# Patient Record
Sex: Male | Born: 1960
Health system: Southern US, Community
[De-identification: ages and names within clinical notes are randomized; demographics above are authoritative.]

## PROBLEM LIST (undated history)

## (undated) DIAGNOSIS — G43909 Migraine, unspecified, not intractable, without status migrainosus: Secondary | ICD-10-CM

## (undated) DIAGNOSIS — F32A Depression, unspecified: Secondary | ICD-10-CM

## (undated) DIAGNOSIS — E785 Hyperlipidemia, unspecified: Secondary | ICD-10-CM

## (undated) DIAGNOSIS — IMO0001 Reserved for inherently not codable concepts without codable children: Secondary | ICD-10-CM

## (undated) DIAGNOSIS — K589 Irritable bowel syndrome without diarrhea: Secondary | ICD-10-CM

## (undated) DIAGNOSIS — B351 Tinea unguium: Secondary | ICD-10-CM

## (undated) DIAGNOSIS — J301 Allergic rhinitis due to pollen: Secondary | ICD-10-CM

## (undated) DIAGNOSIS — G4700 Insomnia, unspecified: Secondary | ICD-10-CM

## (undated) DIAGNOSIS — E069 Thyroiditis, unspecified: Secondary | ICD-10-CM

## (undated) DIAGNOSIS — M7061 Trochanteric bursitis, right hip: Secondary | ICD-10-CM

## (undated) DIAGNOSIS — F329 Major depressive disorder, single episode, unspecified: Secondary | ICD-10-CM

## (undated) DIAGNOSIS — K219 Gastro-esophageal reflux disease without esophagitis: Secondary | ICD-10-CM

## (undated) DIAGNOSIS — E669 Obesity, unspecified: Secondary | ICD-10-CM

## (undated) HISTORY — DX: Obesity, unspecified: E66.9

## (undated) HISTORY — DX: Allergic rhinitis due to pollen: J30.1

## (undated) HISTORY — DX: Gastro-esophageal reflux disease without esophagitis: K21.9

## (undated) HISTORY — DX: Insomnia, unspecified: G47.00

## (undated) HISTORY — DX: Major depressive disorder, single episode, unspecified: F32.9

## (undated) HISTORY — PX: CHOLECYSTECTOMY: SHX55

## (undated) HISTORY — PX: SHOULDER SURGERY: SHX246

## (undated) HISTORY — DX: Trochanteric bursitis, right hip: M70.61

## (undated) HISTORY — DX: Depression, unspecified: F32.A

## (undated) HISTORY — DX: Irritable bowel syndrome, unspecified: K58.9

## (undated) HISTORY — DX: Reserved for inherently not codable concepts without codable children: IMO0001

## (undated) HISTORY — DX: Hyperlipidemia, unspecified: E78.5

## (undated) HISTORY — DX: Tinea unguium: B35.1

## (undated) HISTORY — DX: Thyroiditis, unspecified: E06.9

## (undated) HISTORY — DX: Migraine, unspecified, not intractable, without status migrainosus: G43.909

---

## 1988-12-27 HISTORY — PX: ESOPHAGOGASTRODUODENOSCOPY: SHX1529

## 1999-10-26 ENCOUNTER — Encounter: Payer: Self-pay | Admitting: Family Medicine

## 1999-10-26 ENCOUNTER — Encounter: Admission: RE | Admit: 1999-10-26 | Discharge: 1999-10-26 | Payer: Self-pay | Admitting: Family Medicine

## 2000-01-22 ENCOUNTER — Encounter: Admission: RE | Admit: 2000-01-22 | Discharge: 2000-01-22 | Payer: Self-pay | Admitting: Family Medicine

## 2000-01-22 ENCOUNTER — Encounter: Payer: Self-pay | Admitting: Family Medicine

## 2001-09-21 ENCOUNTER — Encounter: Admission: RE | Admit: 2001-09-21 | Discharge: 2001-09-21 | Payer: Self-pay | Admitting: Family Medicine

## 2001-09-21 ENCOUNTER — Encounter: Payer: Self-pay | Admitting: Family Medicine

## 2007-04-04 ENCOUNTER — Encounter: Admission: RE | Admit: 2007-04-04 | Discharge: 2007-04-04 | Payer: Self-pay | Admitting: Family Medicine

## 2007-04-04 IMAGING — CR DG CERVICAL SPINE COMPLETE 4+V
6 series · 6 of 6 positions shown · non-contrast
Comparison: none

CLINICAL DATA: Left arm numbness for several months.

 CERVICAL SPINE - 5 VIEW:

[view not recorded (1 of 6)]
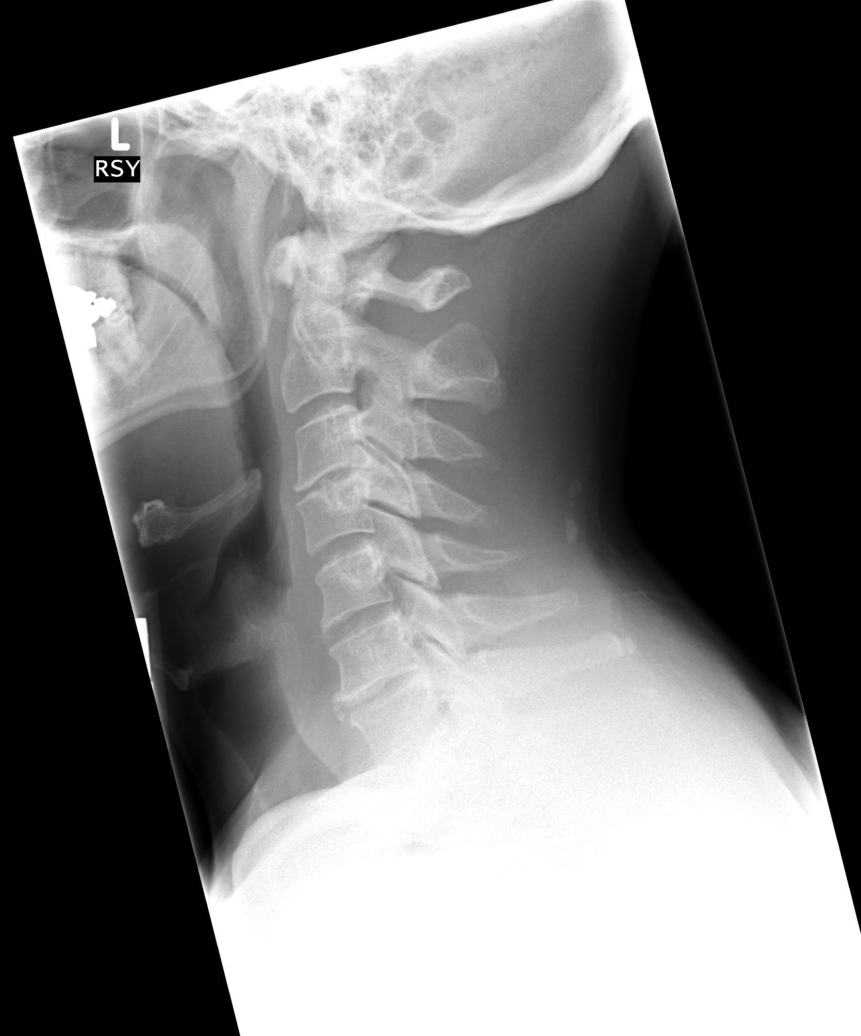

[view not recorded (2 of 6)]
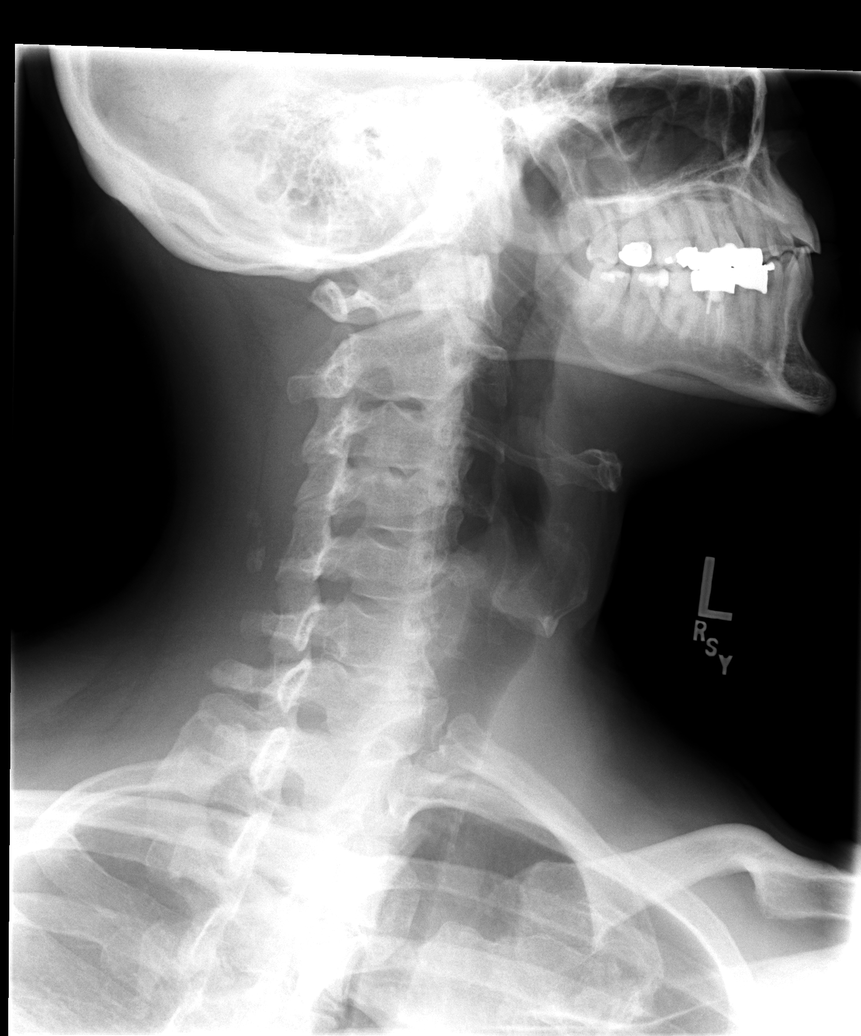

[view not recorded (3 of 6)]
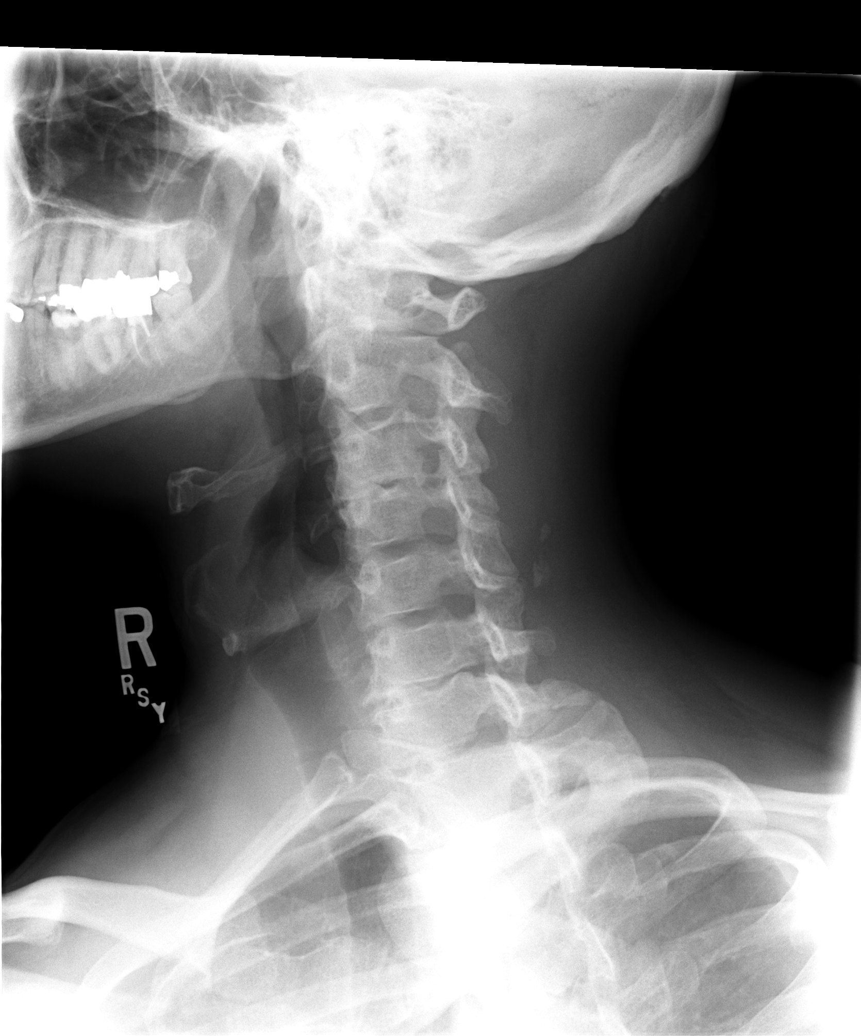

[view not recorded (4 of 6)]
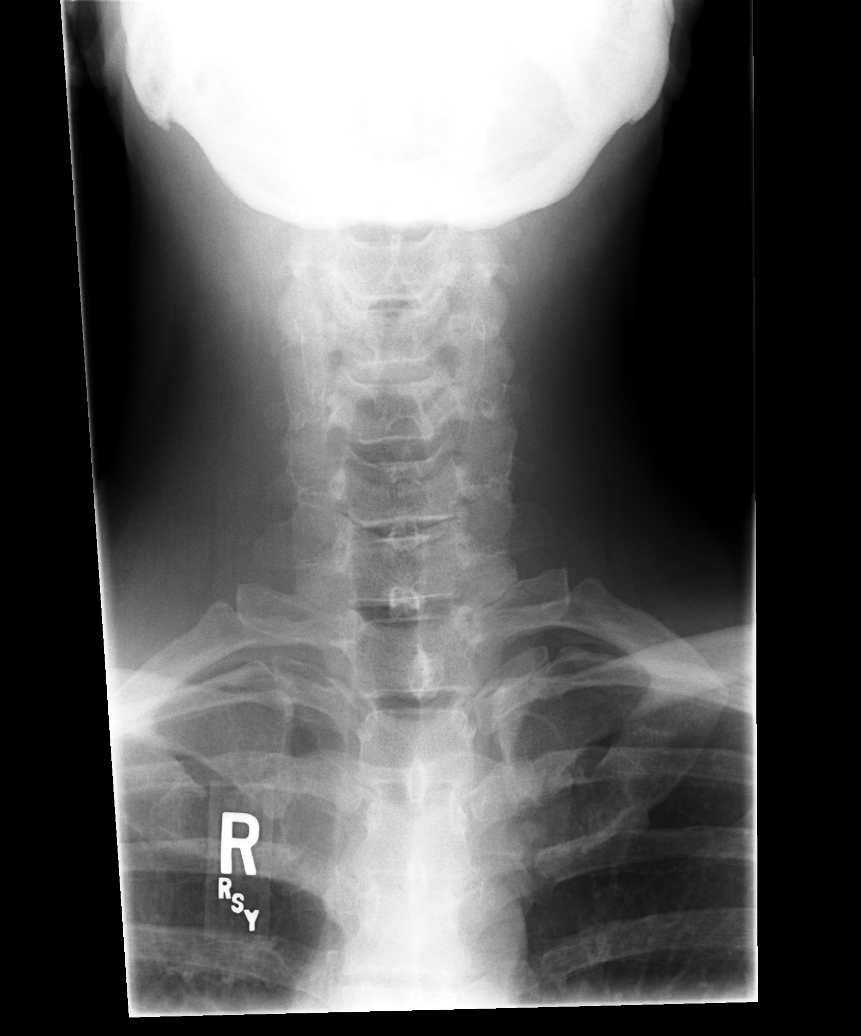

[view not recorded (5 of 6)]
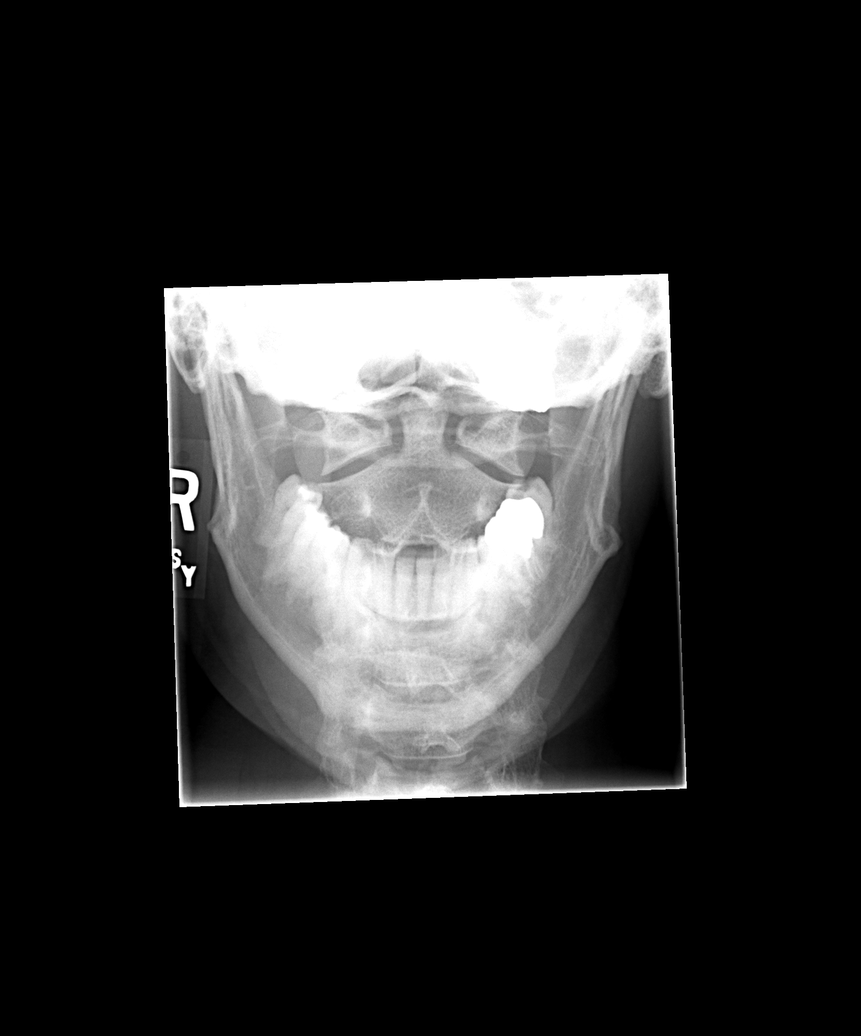

[view not recorded (6 of 6)]
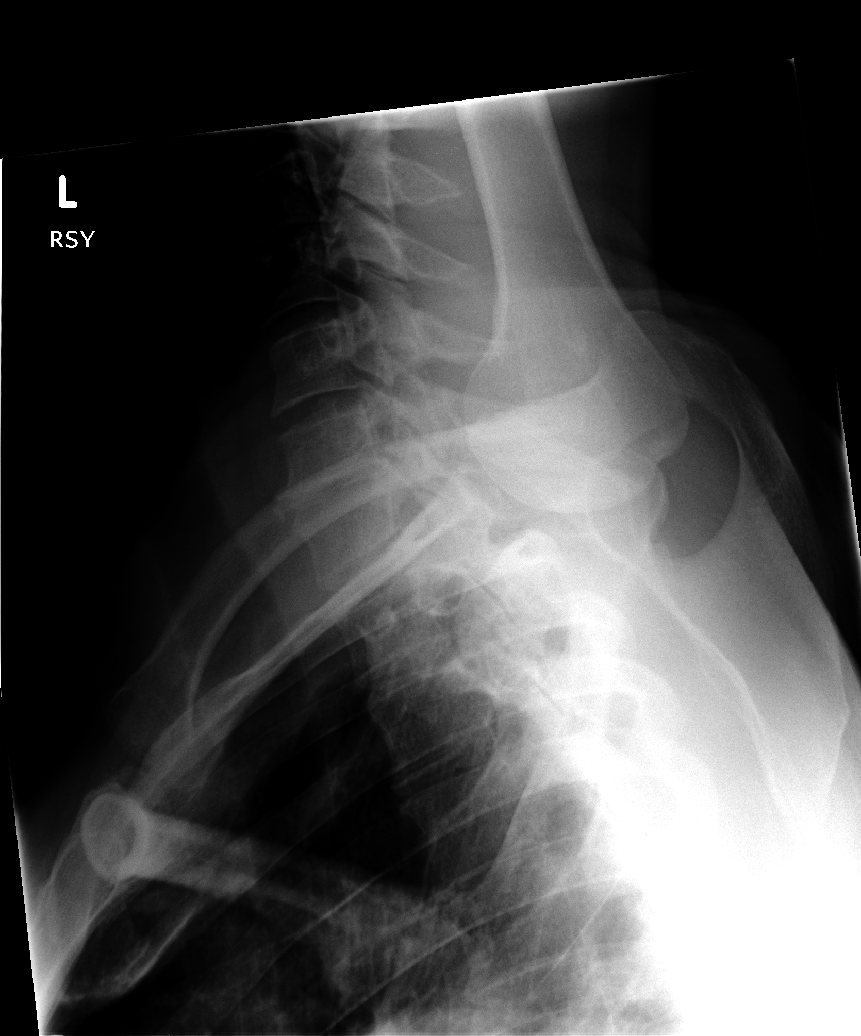

[6 of 6 positions shown; findings below may reference images not displayed]

FINDINGS: Five views of the cervical spine were obtained.  There is degenerative disk disease at the C3-4 and C6-7 levels with loss of disk space, sclerosis and spur formation.  On oblique views, there is foraminal narrowing at both of these levels with the remainder of the foramina being patent.  The odontoid process is intact.
IMPRESSION: Degenerative disk disease at C3-4 and C6-7 with bilateral foraminal narrowing at these levels.

## 2008-06-13 ENCOUNTER — Encounter: Admission: RE | Admit: 2008-06-13 | Discharge: 2008-06-13 | Payer: Self-pay | Admitting: Family Medicine

## 2014-04-26 ENCOUNTER — Other Ambulatory Visit: Payer: Self-pay | Admitting: Internal Medicine

## 2014-04-26 DIAGNOSIS — H3581 Retinal edema: Secondary | ICD-10-CM

## 2014-04-30 ENCOUNTER — Encounter (INDEPENDENT_AMBULATORY_CARE_PROVIDER_SITE_OTHER): Payer: Self-pay

## 2014-04-30 ENCOUNTER — Ambulatory Visit
Admission: RE | Admit: 2014-04-30 | Discharge: 2014-04-30 | Disposition: A | Discharge status: Home / Self Care | Payer: 59 | Source: Ambulatory Visit | Attending: Internal Medicine | Admitting: Internal Medicine

## 2014-04-30 DIAGNOSIS — H3581 Retinal edema: Secondary | ICD-10-CM

## 2015-02-05 ENCOUNTER — Telehealth: Payer: Self-pay | Admitting: Cardiology

## 2015-02-05 NOTE — Telephone Encounter (Signed)
Received records from [facility] (Dr Deland Pretty) for appointment with Dr Martinique on 03/12/15.  Records given to [facility] (medical reccords) for Dr Doug Sou schedule on 03/12/15.  lp

## 2015-03-12 ENCOUNTER — Ambulatory Visit: Payer: Self-pay | Admitting: Cardiology

## 2015-04-30 ENCOUNTER — Ambulatory Visit (INDEPENDENT_AMBULATORY_CARE_PROVIDER_SITE_OTHER): Payer: 59 | Admitting: Cardiology

## 2015-04-30 ENCOUNTER — Encounter: Payer: Self-pay | Admitting: Cardiology

## 2015-04-30 VITALS — BP 104/64 | HR 63 | Ht 67.0 in | Wt 178.9 lb

## 2015-04-30 DIAGNOSIS — R9431 Abnormal electrocardiogram [ECG] [EKG]: Secondary | ICD-10-CM | POA: Diagnosis not present

## 2015-04-30 DIAGNOSIS — E7800 Pure hypercholesterolemia, unspecified: Secondary | ICD-10-CM

## 2015-04-30 NOTE — Progress Notes (Signed)
Cardiology Office Note   Date:  04/30/2015   ID:  Leonard Cook, DOB [birth year], MRN [id number]  PCP:  Horatio Pel, MD  Cardiologist:   Sharry Beining Martinique, MD   Chief Complaint  Patient presents with  . Establish Care    referral from pcp. was worried about was seen on ekg at pcp office      History of Present Illness: Leonard Cook is a 54 y.o. male who presents for evaluation of abnormal Ecg at the request of Dr. Shelia Media. He is generally healthy. He does have a history of dyslipidemia and some family history of CAD. On recent evaluation there was noted some change on his Ecg. He denies any cardiac problems or symptoms. He specifically denies chest pain, SOB, palpitations, or edema. No history of murmur. Stays active although admits he has exercised less recently due to involvement in his children's activities. No history of HTN, DM, or tobacco use. He has lost 10 lbs this year.    Past Medical History  Diagnosis Date  . Hyperlipidemia   . Migraine   . Depressive disorder   . Allergic rhinitis due to pollen   . Reflux   . Onychomycosis     Past Surgical History  Procedure Laterality Date  . Cholecystectomy      age 42     Current Outpatient Prescriptions  Medication Sig Dispense Refill  . aspirin 81 MG tablet Take 81 mg by mouth daily.    . fluticasone (FLONASE) 50 MCG/ACT nasal spray Place into both nostrils daily.    . Multiple Vitamin (MULTIVITAMIN) tablet Take 1 tablet by mouth daily.    Marland Kitchen buPROPion (WELLBUTRIN XL) 150 MG 24 hr tablet Take 1 tablet by mouth daily.  0  . CRESTOR 20 MG tablet Take 1 tablet by mouth daily.    . lansoprazole (PREVACID) 30 MG capsule Take 1 capsule by mouth daily.  0   No current facility-administered medications for this visit.    Allergies:   Amoxicillin; Atorvastatin; Augmentin; Paxil; Serzone; and Zoloft    Social History:  The patient  reports that he has never smoked. He does not have any smokeless tobacco history on  file.   Family History:  The patient's family history includes Heart attack in his paternal grandmother; Heart disease (age of onset: 69) in his father; Hyperlipidemia in his brother, brother, and father; Hypertension in his brother; Prostate cancer in his father; Stroke in his paternal grandfather.    ROS:  Please see the history of present illness.   Otherwise, review of systems are positive for none.   All other systems are reviewed and negative.    PHYSICAL EXAM: VS:  BP 104/64 mmHg  Pulse 63  Ht 5\' 7"  (1.702 m)  Wt 178 lb 14.4 oz (81.149 kg)  BMI 28.01 kg/m2 , BMI Body mass index is 28.01 kg/(m^2). GEN: Well nourished, well developed, in no acute distress HEENT: normal Neck: no JVD, carotid bruits, or masses Cardiac: RRR; no murmurs, rubs, or gallops,no edema  Respiratory:  clear to auscultation bilaterally, normal work of breathing GI: soft, nontender, nondistended, + BS MS: no deformity or atrophy Skin: warm and dry, no rash Neuro:  Strength and sensation are intact Psych: euthymic mood, full affect   EKG:  EKG is ordered today. The ekg ordered today demonstrates NSR with normal Ecg. I have personally reviewed and interpreted this study. Ecg from 02/04/15 and 01/15/14. These show poor septal forces. The Ecg from 02/04/15  appears to have motion artifact in leads Avf and V3. No other significant ST-T changes.   Recent Labs: No results found for requested labs within last 365 days.    Lipid Panel No results found for: CHOL, TRIG, HDL, CHOLHDL, VLDL, LDLCALC, LDLDIRECT    Wt Readings from Last 3 Encounters:  04/30/15 178 lb 14.4 oz (81.149 kg)      Other studies Reviewed: Additional studies/ records that were reviewed today include: none. Review of the above records demonstrates: N/A   ASSESSMENT AND PLAN:  1.  Abnormal Ecg. Prior Ecg show decreased forces in V1-2 but Ecg today looks normal. This may be a lead placement issue. I do not see any significant ST-T changes  other than some artifact. He has no cardiac symptoms. He does have risk factors of dyslipidemia and some family history of vascular disease. At this point I would recommend continued risk factor modification with lipid lowering therapy, regular aerobic exercise, weight control, and healthy diet. I would not pursue stress testing unless he develops new symptoms. I will follow up as needed.    Current medicines are reviewed at length with the patient today.  The patient does not have concerns regarding medicines.  The following changes have been made:  no change  Labs/ tests ordered today include:  Orders Placed This Encounter  Procedures  . EKG 12-Lead     Disposition:   FU with prn   Signed, Waldon Sheerin Martinique, MD, [facility] 04/30/2015 5:55 PM    [facility] [address], [place], [place], [address] Phone [phone], Fax [phone]

## 2015-05-02 ENCOUNTER — Encounter: Payer: Self-pay | Admitting: Cardiology

## 2015-12-02 ENCOUNTER — Other Ambulatory Visit: Payer: Self-pay | Admitting: Orthopedic Surgery

## 2015-12-02 DIAGNOSIS — S8992XA Unspecified injury of left lower leg, initial encounter: Secondary | ICD-10-CM | POA: Insufficient documentation

## 2015-12-03 ENCOUNTER — Ambulatory Visit
Admission: RE | Admit: 2015-12-03 | Discharge: 2015-12-03 | Disposition: A | Discharge status: Home / Self Care | Payer: 59 | Source: Ambulatory Visit | Attending: Orthopedic Surgery | Admitting: Orthopedic Surgery

## 2015-12-03 DIAGNOSIS — S8992XA Unspecified injury of left lower leg, initial encounter: Secondary | ICD-10-CM

## 2016-01-13 DIAGNOSIS — Z9889 Other specified postprocedural states: Secondary | ICD-10-CM | POA: Insufficient documentation

## 2016-01-28 HISTORY — PX: KNEE ARTHROSCOPY WITH ANTERIOR CRUCIATE LIGAMENT (ACL) REPAIR: SHX5644

## 2016-11-10 ENCOUNTER — Other Ambulatory Visit: Payer: Self-pay | Admitting: Internal Medicine

## 2016-11-10 DIAGNOSIS — R079 Chest pain, unspecified: Secondary | ICD-10-CM

## 2016-11-11 ENCOUNTER — Ambulatory Visit (HOSPITAL_COMMUNITY)
Admission: RE | Admit: 2016-11-11 | Discharge: 2016-11-11 | Disposition: A | Discharge status: Home / Self Care | Payer: 59 | Source: Ambulatory Visit | Attending: Cardiology | Admitting: Cardiology

## 2016-11-11 DIAGNOSIS — R079 Chest pain, unspecified: Secondary | ICD-10-CM | POA: Diagnosis not present

## 2016-11-11 LAB — EXERCISE TOLERANCE TEST
CHL RATE OF PERCEIVED EXERTION: 17
CSEPEW: 14.1 METS
CSEPHR: 104 %
Exercise duration (min): 12 min
Exercise duration (sec): 25 s
MPHR: 164 {beats}/min
Peak HR: 171 {beats}/min
Rest HR: 78 {beats}/min

## 2017-01-07 DIAGNOSIS — H47321 Drusen of optic disc, right eye: Secondary | ICD-10-CM | POA: Diagnosis not present

## 2017-01-07 DIAGNOSIS — H04123 Dry eye syndrome of bilateral lacrimal glands: Secondary | ICD-10-CM | POA: Diagnosis not present

## 2017-01-20 DIAGNOSIS — Z9889 Other specified postprocedural states: Secondary | ICD-10-CM | POA: Diagnosis not present

## 2017-01-20 DIAGNOSIS — S8992XD Unspecified injury of left lower leg, subsequent encounter: Secondary | ICD-10-CM | POA: Diagnosis not present

## 2017-01-20 DIAGNOSIS — Z967 Presence of other bone and tendon implants: Secondary | ICD-10-CM | POA: Diagnosis not present

## 2017-01-23 DIAGNOSIS — Z23 Encounter for immunization: Secondary | ICD-10-CM | POA: Diagnosis not present

## 2017-02-10 DIAGNOSIS — Z125 Encounter for screening for malignant neoplasm of prostate: Secondary | ICD-10-CM | POA: Diagnosis not present

## 2017-02-10 DIAGNOSIS — Z0000 Encounter for general adult medical examination without abnormal findings: Secondary | ICD-10-CM | POA: Diagnosis not present

## 2017-02-15 DIAGNOSIS — K589 Irritable bowel syndrome without diarrhea: Secondary | ICD-10-CM | POA: Diagnosis not present

## 2017-02-15 DIAGNOSIS — E7800 Pure hypercholesterolemia, unspecified: Secondary | ICD-10-CM | POA: Diagnosis not present

## 2017-02-15 DIAGNOSIS — Z79899 Other long term (current) drug therapy: Secondary | ICD-10-CM | POA: Diagnosis not present

## 2017-02-15 DIAGNOSIS — Z0001 Encounter for general adult medical examination with abnormal findings: Secondary | ICD-10-CM | POA: Diagnosis not present

## 2017-03-03 DIAGNOSIS — D485 Neoplasm of uncertain behavior of skin: Secondary | ICD-10-CM | POA: Diagnosis not present

## 2017-03-03 DIAGNOSIS — L578 Other skin changes due to chronic exposure to nonionizing radiation: Secondary | ICD-10-CM | POA: Diagnosis not present

## 2017-03-03 DIAGNOSIS — L720 Epidermal cyst: Secondary | ICD-10-CM | POA: Diagnosis not present

## 2017-03-03 DIAGNOSIS — D225 Melanocytic nevi of trunk: Secondary | ICD-10-CM | POA: Diagnosis not present

## 2017-03-18 DIAGNOSIS — H10413 Chronic giant papillary conjunctivitis, bilateral: Secondary | ICD-10-CM | POA: Diagnosis not present

## 2017-03-18 DIAGNOSIS — H04123 Dry eye syndrome of bilateral lacrimal glands: Secondary | ICD-10-CM | POA: Diagnosis not present

## 2017-09-26 ENCOUNTER — Encounter: Payer: Self-pay | Admitting: Neurology

## 2017-09-26 ENCOUNTER — Ambulatory Visit (INDEPENDENT_AMBULATORY_CARE_PROVIDER_SITE_OTHER): Payer: 59 | Admitting: Neurology

## 2017-09-26 ENCOUNTER — Encounter (INDEPENDENT_AMBULATORY_CARE_PROVIDER_SITE_OTHER): Payer: Self-pay

## 2017-09-26 VITALS — BP 126/87 | HR 80 | Ht 67.0 in | Wt 160.0 lb

## 2017-09-26 DIAGNOSIS — F5104 Psychophysiologic insomnia: Secondary | ICD-10-CM

## 2017-09-26 DIAGNOSIS — R202 Paresthesia of skin: Secondary | ICD-10-CM | POA: Diagnosis not present

## 2017-09-26 DIAGNOSIS — R200 Anesthesia of skin: Secondary | ICD-10-CM

## 2017-09-26 NOTE — Progress Notes (Signed)
SLEEP MEDICINE CLINIC   Provider:  Larey Seat, M D  Primary Care Physician:  Deland Pretty, MD   Referring Provider: Deland Pretty, MD   Chief Complaint  Patient presents with  . New Patient (Initial Visit)    late july, pt noticed left arm numbness and feels like could be in the muscle. pt has tried slee    HPI:  Leonard Cook is a 56 y.o. male , seen here as in a referral  from Dr. Shelia Media for left arm numbness.  Chief complaint according to patient : left arm numbness without swelling , the patient is right hand dominant. He also has sleep problems.   The patient has noticed a left arm numbness without associated swelling, he describes the sensation as coming from the depth of his arm and forearm on the medial side. This occurs when he drives, carries a backpack is active. He normally doesn't notice anything during sleep or in the morning hours just after waking up. He begun having the sensation in late July. He has been treated for depression-anxiety and has used different antidepressant. He takes Wellbutrin in the morning, but developed a very mild bilateral tremor. This is likely medication induced has not changed his handwriting or coordination. He has been treated for trochanteric bursitis of the right hip, major depressive disorder, irritable bowel syndrome, GERD, and has a history of thyroiditis. I reviewed his current medications he has been on Ambien 10 mg daily at bedtime. He takes bupropion extended release 12 hour tablet orally once a day only 100 mg. Lorazepam 0.5 milligrams 4 times a day. Simvastatin 20 mg daily.  [name] reported that during his high school years in college years he did suffer some neck injuries, he had wondered if there is a circulatory issue versus a neuropathic or radicular nerve injury. He had no recent neck trauma, head trauma.  Sleep habits are as follows:  " all of my problems have been relating to my not-sleeping". The patient usually goes  to bed between 10:30 and 12:30, but struggles to go to sleep. He currently takes Ambien and still waits hours before sleep cycles. He wakes up frequently during the night usually at 4:35 AM which is not the time he has to arise, with difficulties to fall asleep again. Feeling clammy and distressed. Gets only 4-5 hours of sleep at the max.    Social history: works for [employer].  Lost his parents 70 and 12 month ago- felt the grief evolved into insomnia, and his own mortality. His children are 31 and 53 years old, married . Works long hours as a Editor, commissioning for [employer],  Worried a lot about professional future, the changes in his company- his children being you.  Caffeine - only in AM, 1/2 soda some days. ETOH- yes, 2 beers a week. No tobacco use.      Review of Systems: Out of a complete 14 system review, the patient complains of only the following symptoms, and all other reviewed systems are negative. I feel tired, I have insomnia, aches and pains.   Epworth score  N/a  , Fatigue severity score n/a  , depression score n/a    Social History   Social History  . Marital status: Married    Spouse name: N/A  . Number of children: 2  . Years of education: N/A   Occupational History  . Nurse, learning disability    Social History Main Topics  . Smoking  status: Never Smoker  . Smokeless tobacco: Never Used  . Alcohol use Yes     Comment: 2 beers a wk  . Drug use: No  . Sexual activity: Not on file   Other Topics Concern  . Not on file   Social History Narrative  . No narrative on file    Family History  Problem Relation Age of Onset  . Prostate cancer Father   . Hyperlipidemia Father   . Heart disease Father 18       PCI x2  . Hyperlipidemia Brother   . Hypertension Brother   . Heart attack Paternal Grandmother   . Stroke Paternal Grandfather   . Hyperlipidemia Brother     Past Medical History:  Diagnosis Date  . Allergic rhinitis due to pollen   .  Depressive disorder   . Hyperlipidemia   . Migraine   . Onychomycosis   . Reflux     Past Surgical History:  Procedure Laterality Date  . CHOLECYSTECTOMY     age 73    Current Outpatient Prescriptions  Medication Sig Dispense Refill  . aspirin 81 MG tablet Take 81 mg by mouth daily.    Marland Kitchen buPROPion (WELLBUTRIN SR) 100 MG 12 hr tablet   0  . LORazepam (ATIVAN) 0.5 MG tablet take 1 tablet by mouth four times a day if needed for anxiety  0  . Multiple Vitamin (MULTIVITAMIN) tablet Take 1 tablet by mouth daily.    . simvastatin (ZOCOR) 20 MG tablet Take 20 mg by mouth.    . zolpidem (AMBIEN) 10 MG tablet take 1 tablet by mouth at bedtime if needed for sleep  0   No current facility-administered medications for this visit.     Allergies as of 09/26/2017 - Review Complete 09/26/2017  Allergen Reaction Noted  . Amoxicillin Other (See Comments) 10/04/2014  . Atorvastatin Other (See Comments) 10/04/2014  . Augmentin [amoxicillin-pot clavulanate] Other (See Comments) 10/04/2014  . Paxil [paroxetine hcl] Diarrhea 10/04/2014  . Serzone [nefazodone]  10/04/2014  . Zoloft [sertraline hcl] Diarrhea 10/04/2014    Vitals: BP 126/87   Pulse 80   Ht 5\' 7"  (1.702 m)   Wt 160 lb (72.6 kg)   BMI 25.06 kg/m  Last Weight:  Wt Readings from Last 1 Encounters:  09/26/17 160 lb (72.6 kg)   ZOX:WRUE mass index is 25.06 kg/m.     Last Height:   Ht Readings from Last 1 Encounters:  09/26/17 5\' 7"  (1.702 m)    Physical exam:  General: The patient is awake, alert and appears not in acute distress. The patient is well groomed. Head: Normocephalic, atraumatic. Neck is supple. Mallampati 2  neck circumference:15.75  Nasal airflow patent , Retrognathia is not seen.  Cardiovascular:  Regular rate and rhythm , without  murmurs or carotid bruit, and without distended neck veins. Respiratory: Lungs are clear to auscultation. Skin:  Without evidence of edema, or rash Trunk: BMI is elevated - not  obese. . The patient's posture is erect  Neurologic exam : The patient is awake and alert, oriented to place and time.   Memory subjective described as impaired  Attention span & concentration ability appears normal.  Speech is fluent,  without  dysarthria, dysphonia or aphasia.  Mood and affect are depressed.   Cranial nerves: Pupils are equal and briskly reactive to light.  Extraocular movements  in vertical and horizontal planes intact and without nystagmus. Visual fields by finger perimetry are intact.Hearing to finger rub intact.  Facial sensation intact to fine touch.Facial motor strength is symmetric and tongue and uvula move midline. Shoulder shrug was symmetrical.   Motor exam:   Normal tone, muscle bulk and symmetric strength in all extremities. Sensory:  Fine touch, pinprick and vibration were tested in all extremities. His left arm medial antebrachium is feeling numb, coming and going.  Proprioception tested in the upper extremities was normal.  Coordination:Finger-to-nose maneuver  normal without evidence of ataxia, dysmetria or tremor. Gait and station: Patient walks without assistive device . Stance is stable and normal.  Turns with 3 Steps. Romberg testing is  Negative. status left ACL repair.  Deep tendon reflexes: in the  upper and lower extremities are symmetrically attenuated  and intact. Babinski maneuver response is  downgoing.  Assessment:  After physical and neurologic examination, review of laboratory studies,  Personal review of imaging studies, reports of other /same  Imaging studies, results of polysomnography and / or neurophysiology testing and pre-existing records as far as provided in visit., my assessment is   1)  I will evaluate Mr. Plessinger's let arm numbness by NCV and EMG- I don't see a circulatory issue here- his arms is equally warm,  He also has similar muscle mass, both sides have equal reflexes. Marland Kitchen    2) Depression with somatization.  3) Insomnia -  chronic and related to depression, anxiety - early morning awakenings- on Ambien without success. I recommend cognitive behaviour therapy.  May return to 150 mg Wellbutrin in AM. He failed trazodone. Melatonin has not worked. He doesn't snore and has not had witnessed apnea.   The patient was advised of the nature of the diagnosed disorder , the treatment options and the  risks for general health and wellness arising from not treating the condition.   I spent more than 45 minutes of face to face time with the patient.  Greater than 50% of time was spent in counseling and coordination of care. We have discussed the diagnosis and differential and I answered the patient's questions.    Plan:  Treatment plan and additional workup :   Arm numbness- check cervical spine and NCV/EMG . He has noted strength loss in the left arm- I cannot objectify this.  Insomnia- has all the signs of depression related anxiety and insomnia. Referral to cognitive behavior therapy per Dr. Shelia Media ( or me ) -Needs sleep hygiene adjustments. May use ambien- alternating with trazodone to sleep.  Xanax / Ativan / Klonopin may be used for 21 days. Habit forming.   Rv after EMG/ NCV    Larey Seat, MD 97/08/4800, 65:53 AM  Certified in Neurology by ABPN Certified in [place] by [facility] [address] [place], [place] [address]

## 2017-09-26 NOTE — Patient Instructions (Signed)

## 2017-10-12 ENCOUNTER — Ambulatory Visit (INDEPENDENT_AMBULATORY_CARE_PROVIDER_SITE_OTHER): Payer: 59 | Admitting: Neurology

## 2017-10-12 ENCOUNTER — Encounter: Payer: Self-pay | Admitting: Neurology

## 2017-10-12 ENCOUNTER — Ambulatory Visit (INDEPENDENT_AMBULATORY_CARE_PROVIDER_SITE_OTHER): Payer: Self-pay | Admitting: Neurology

## 2017-10-12 DIAGNOSIS — R202 Paresthesia of skin: Secondary | ICD-10-CM

## 2017-10-12 DIAGNOSIS — R200 Anesthesia of skin: Secondary | ICD-10-CM

## 2017-10-12 DIAGNOSIS — F5104 Psychophysiologic insomnia: Secondary | ICD-10-CM

## 2017-10-12 NOTE — Progress Notes (Signed)
Please refer EMG and nerve conduction study procedure note. 

## 2017-10-12 NOTE — Procedures (Signed)
     HISTORY:  Leonard Cook is a 56 year old gentleman with onset of left forearm discomfort and some numbness into the hand that began in July 2018. He denies any neck or shoulder discomfort. There may be some slight weakness of the left hand. He is being evaluated for this issue.  NERVE CONDUCTION STUDIES:  Nerve conduction studies were performed on both upper extremities. The distal motor latencies and motor amplitudes for the median and ulnar nerves were within normal limits. The F wave latencies and nerve conduction velocities for these nerves were also normal. The sensory latencies for the median and ulnar nerves were normal.   EMG STUDIES:  EMG study was performed on the left upper extremity:  The first dorsal interosseous muscle reveals 2 to 4 K units with full recruitment. No fibrillations or positive waves were noted. The abductor pollicis brevis muscle reveals 2 to 4 K units with full recruitment. No fibrillations or positive waves were noted. The extensor indicis proprius muscle reveals 1 to 3 K units with full recruitment. No fibrillations or positive waves were noted. The pronator teres muscle reveals 2 to 3 K units with full recruitment. No fibrillations or positive waves were noted. The biceps muscle reveals 1 to 2 K units with full recruitment. No fibrillations or positive waves were noted. The triceps muscle reveals 2 to 4 K units with full recruitment. No fibrillations or positive waves were noted. The anterior deltoid muscle reveals 2 to 3 K units with full recruitment. No fibrillations or positive waves were noted. The cervical paraspinal muscles were tested at 2 levels. No abnormalities of insertional activity were seen at either level tested. There was good relaxation.   IMPRESSION:  Nerve conduction studies done on both upper extremities were within normal limits. No evidence of a neuropathy is seen. EMG evaluation of the left upper extremity is normal, there is  no evidence of an overlying cervical radiculopathy.  Leonard Alexanders MD 10/12/2017 1:33 PM  [facility] [address] [place], [place] [address]  Phone [phone] Fax [phone]

## 2017-10-13 ENCOUNTER — Telehealth: Payer: Self-pay | Admitting: Neurology

## 2017-10-13 NOTE — Telephone Encounter (Signed)
Called patient and made him aware that the NCV and EMG were both normal per Dr Brett Fairy. Pt had no questions and verbalized understanding.

## 2017-10-13 NOTE — Progress Notes (Signed)
    [place]    Nerve / Sites Muscle Latency Ref. Amplitude Ref. Rel Amp Segments Distance Velocity Ref. Area    ms ms mV mV %  cm m/s m/s mVms  L Median - APB     Wrist APB 3.5 ?4.4 12.1 ?4.0 100 Wrist - APB 7   36.5     Upper arm APB 7.5  12.3  102 Upper arm - Wrist 25 62 ?49 35.2  R Median - APB     Wrist APB 3.8 ?4.4 12.3 ?4.0 100 Wrist - APB 7   34.5     Upper arm APB 8.1  12.1  98 Upper arm - Wrist 24 56 ?49 33.7  L Ulnar - ADM     Wrist ADM 3.4 ?3.3 13.2 ?6.0 100 Wrist - ADM 7   37.3     B.Elbow ADM 6.9  11.8  89 B.Elbow - Wrist 19 55 ?49 38.4     A.Elbow ADM 8.6  11.6  98.1 A.Elbow - B.Elbow 10 58 ?49 38.9         A.Elbow - Wrist      R Ulnar - ADM     Wrist ADM 2.4 ?3.3 15.4 ?6.0 100 Wrist - ADM 7   39.4     B.Elbow ADM 6.1  14.9  96.6 B.Elbow - Wrist 21 56 ?49 38.2     A.Elbow ADM 7.8  14.5  97.7 A.Elbow - B.Elbow 10 60 ?49 37.7         A.Elbow - Wrist                 SNC    Nerve / Sites Rec. Site Peak Lat Amp Segments Distance    ms V  cm  L Median - Orthodromic (Dig II, Mid palm)     Dig II Wrist 3.4 26 Dig II - Wrist 13  R Median - Orthodromic (Dig II, Mid palm)     Dig II Wrist 3.5 31 Dig II - Wrist 13  L Ulnar - Orthodromic, (Dig V, Mid palm)     Dig V Wrist 3.2 21 Dig V - Wrist 11  R Ulnar - Orthodromic, (Dig V, Mid palm)     Dig V Wrist 2.8 15 Dig V - Wrist 69             F  Wave    Nerve F Lat Ref.   ms ms  L Median - APB 29.0 ?31.0  L Ulnar - ADM 29.0 ?32.0  R Median - APB 28.6 ?31.0  R Ulnar - ADM 29.8 ?32.0

## 2018-01-13 DIAGNOSIS — H66002 Acute suppurative otitis media without spontaneous rupture of ear drum, left ear: Secondary | ICD-10-CM | POA: Diagnosis not present

## 2018-01-13 DIAGNOSIS — H60392 Other infective otitis externa, left ear: Secondary | ICD-10-CM | POA: Diagnosis not present

## 2018-02-17 DIAGNOSIS — Z125 Encounter for screening for malignant neoplasm of prostate: Secondary | ICD-10-CM | POA: Diagnosis not present

## 2018-02-17 DIAGNOSIS — Z0000 Encounter for general adult medical examination without abnormal findings: Secondary | ICD-10-CM | POA: Diagnosis not present

## 2018-02-22 DIAGNOSIS — Z0000 Encounter for general adult medical examination without abnormal findings: Secondary | ICD-10-CM | POA: Diagnosis not present

## 2018-02-22 DIAGNOSIS — Z1212 Encounter for screening for malignant neoplasm of rectum: Secondary | ICD-10-CM | POA: Diagnosis not present

## 2018-02-22 DIAGNOSIS — E7800 Pure hypercholesterolemia, unspecified: Secondary | ICD-10-CM | POA: Diagnosis not present

## 2018-02-22 DIAGNOSIS — K589 Irritable bowel syndrome without diarrhea: Secondary | ICD-10-CM | POA: Diagnosis not present

## 2018-02-27 ENCOUNTER — Encounter: Payer: Self-pay | Admitting: Internal Medicine

## 2018-04-10 ENCOUNTER — Ambulatory Visit (INDEPENDENT_AMBULATORY_CARE_PROVIDER_SITE_OTHER): Payer: 59 | Admitting: Internal Medicine

## 2018-04-10 ENCOUNTER — Encounter (INDEPENDENT_AMBULATORY_CARE_PROVIDER_SITE_OTHER): Payer: Self-pay

## 2018-04-10 ENCOUNTER — Encounter: Payer: Self-pay | Admitting: Internal Medicine

## 2018-04-10 VITALS — BP 106/80 | HR 64 | Ht 67.0 in | Wt 171.0 lb

## 2018-04-10 DIAGNOSIS — R6889 Other general symptoms and signs: Secondary | ICD-10-CM | POA: Diagnosis not present

## 2018-04-10 NOTE — Progress Notes (Signed)
Leonard Cook 57 y.o. April 16, 1961 [id number] Referred by: Deland Pretty, MD Assessment & Plan:   Encounter Diagnosis  Name Primary?  . Abnormal digital rectal exam Yes    ? If there is a tiny tag at 8 O'clock - otherwise I do not see an abnormality or palpate one and he is utd w/ CRCA screening (negative Cologuard 2017 and recent iFOBT negative)  Will review with Dr. Shelia Media - colonoscopy is presented as a possible option though he and I are not settled on that at present.  Subjective:   Chief Complaint: abormal rectal exam  HPI The patient is here at the request of Dr. Shelia Media because of an abnormal rectal exam question hemorrhoid versus skin tag versus other.  Examination on February 22, 2018 reveals question internal hemorrhoid versus skin tag.  He had a negative I FOBT test of the stool that day.  He reports having had a negative Cologuard in 2017 for colon cancer screening.  He is concerned as to whether or not he has had adequate colon cancer screening with the stool testing, he is also had Hemoccults over the years also and is never had a problem with those.  He does have rare rectal bleeding with wiping if he has a hard stool 1-3 times a year perhaps.  His mother recently died of gallbladder cancer and his mother-in-law is 2 years out from a diagnosis and treatment for pancreatic cancer with possible recurrence coming and he is concerned about the possibility of having colon cancer or other cancers.  He does have a long-standing history of some irregular defecation and a diagnosis of IBS.  Labs from February of this year show a normal CBC.  Normal comprehensive metabolic panel also.  No other change in bowel habits or persistent problems like that described.  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 Allergies  Allergen Reactions  . Amoxicillin Other (See Comments)    Fever,  fatigue  . Atorvastatin Other (See Comments)    myalgia  . Augmentin [Amoxicillin-Pot Clavulanate] Other (See Comments)    myalgia  . Paxil [Paroxetine Hcl] Diarrhea  . Serzone [Nefazodone]     Feels spacey  . Trintellix [Vortioxetine] Other (See Comments)    Severe up and downs, lightheaded and jittery  . Zoloft [Sertraline Hcl] Diarrhea   Current Meds  Medication Sig  . aspirin 81 MG tablet Take 81 mg by mouth daily.  Marland Kitchen buPROPion (WELLBUTRIN SR) 100 MG 12 hr tablet 300 mg.   . Multiple Vitamin (MULTIVITAMIN) tablet Take 1 tablet by mouth daily.  . simvastatin (ZOCOR) 20 MG tablet Take 20 mg by mouth.   Past Medical History:  Diagnosis Date  . Allergic rhinitis due to pollen   . Depressive disorder   . GERD (gastroesophageal reflux disease)   . Hyperlipidemia   . IBS (irritable bowel syndrome)   . Insomnia   . Migraine   . Obesity   . Onychomycosis   . Reflux   . Thyroiditis   . Trochanteric bursitis of right hip    Past Surgical History:  Procedure Laterality Date  . CHOLECYSTECTOMY     age 73  . ESOPHAGOGASTRODUODENOSCOPY  1990  . 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   Review of Systems As per HPI.  Some depressed mood and anxiety fatigue and insomnia.  Reports some night sweats at times.  All other review of systems are negative  Objective:    Physical Exam BP 106/80   Pulse 64   Ht 5\' 7"  (1.702 m)   Wt 171 lb (77.6 kg)   BMI 26.78 kg/m  NAD Eyes anicteric Lungs cta Cor S1s2 no rmg abd soft and NT  Rectal - anoderm w/ sl fleshy bulge at 8 O'clock nontender  Otherwise NL DRE with formed brown stool and NL prostate  Psych: mildly anxious  Data reviewed: feb 2029 PCP notes

## 2018-04-10 NOTE — Patient Instructions (Signed)
  It has been recommended to you by your physician that you have a(n) colonoscopy completed. Per your request, we did not schedule the procedure(s) today. Please contact our office at [phone] should you decide to have the procedure completed.    Dr Carlean Purl will talk with Dr Shelia Media about your visit today.    I appreciate the opportunity to care for you. Silvano Rusk, MD, [facility]

## 2018-04-12 DIAGNOSIS — E7800 Pure hypercholesterolemia, unspecified: Secondary | ICD-10-CM | POA: Diagnosis not present

## 2018-09-11 DIAGNOSIS — M25511 Pain in right shoulder: Secondary | ICD-10-CM | POA: Diagnosis not present

## 2018-09-11 DIAGNOSIS — M542 Cervicalgia: Secondary | ICD-10-CM | POA: Diagnosis not present

## 2018-09-19 DIAGNOSIS — M25511 Pain in right shoulder: Secondary | ICD-10-CM | POA: Diagnosis not present

## 2018-09-19 DIAGNOSIS — M542 Cervicalgia: Secondary | ICD-10-CM | POA: Diagnosis not present

## 2018-09-26 DIAGNOSIS — M542 Cervicalgia: Secondary | ICD-10-CM | POA: Diagnosis not present

## 2018-09-26 DIAGNOSIS — M25511 Pain in right shoulder: Secondary | ICD-10-CM | POA: Diagnosis not present

## 2018-10-25 DIAGNOSIS — M7541 Impingement syndrome of right shoulder: Secondary | ICD-10-CM | POA: Diagnosis not present

## 2018-10-25 DIAGNOSIS — M25511 Pain in right shoulder: Secondary | ICD-10-CM | POA: Diagnosis not present

## 2018-10-25 DIAGNOSIS — M542 Cervicalgia: Secondary | ICD-10-CM | POA: Diagnosis not present

## 2018-11-02 DIAGNOSIS — D485 Neoplasm of uncertain behavior of skin: Secondary | ICD-10-CM | POA: Diagnosis not present

## 2018-11-02 DIAGNOSIS — L281 Prurigo nodularis: Secondary | ICD-10-CM | POA: Diagnosis not present

## 2018-11-02 DIAGNOSIS — L570 Actinic keratosis: Secondary | ICD-10-CM | POA: Diagnosis not present

## 2018-11-02 DIAGNOSIS — L723 Sebaceous cyst: Secondary | ICD-10-CM | POA: Diagnosis not present

## 2018-11-02 DIAGNOSIS — L821 Other seborrheic keratosis: Secondary | ICD-10-CM | POA: Diagnosis not present

## 2018-11-02 DIAGNOSIS — D225 Melanocytic nevi of trunk: Secondary | ICD-10-CM | POA: Diagnosis not present

## 2019-02-01 DIAGNOSIS — M7541 Impingement syndrome of right shoulder: Secondary | ICD-10-CM | POA: Insufficient documentation

## 2020-10-08 NOTE — Progress Notes (Signed)
Cardiology Office Note   Date:  10/09/2020   ID:  OLANREWAJU Cook, DOB Jun 29, 1961, MRN [id number]  PCP:  Deland Pretty, MD  Cardiologist:   Leonard Taras Martinique, MD   Chief Complaint  Patient presents with   Follow-up   Chest Pain    Tightness and ache.   Shortness of Breath      History of Present Illness: Leonard Cook is a 59 y.o. male who is seen at the request of Dr Shelia Media for evaluation of chest pain. He has a history of HLD and obesity. I saw him remotely in 2016 for an abnormal Ecg. Ecg on our visit was normal. He had an ETT in November 2017 that showed excellent exercise tolerance with hypertensive response. There was ST elevation noted in lead V1 but since this was a single lead this was felt to be OK.  The patient reports that over a 6 week period he experienced chest pain in the left precordial area. It was 3-4/10 in severity and dull. He had radiation to the left arm. Pain would wax and wane sometimes lasting a day and a half. Some SOB. He did not clearly correlate this will activity. Over the past week he states the symptoms are better. His father had CAD in his early 21s but lived to be 55.   Past Medical History:  Diagnosis Date   Allergic rhinitis due to pollen    Depressive disorder    GERD (gastroesophageal reflux disease)    Hyperlipidemia    IBS (irritable bowel syndrome)    Insomnia    Migraine    Obesity    Onychomycosis    Reflux    Thyroiditis    Trochanteric bursitis of right hip     Past Surgical History:  Procedure Laterality Date   CHOLECYSTECTOMY     age 70   ESOPHAGOGASTRODUODENOSCOPY  26   KNEE ARTHROSCOPY WITH ANTERIOR CRUCIATE LIGAMENT (ACL) REPAIR  01/2016     Current Outpatient Medications  Medication Sig Dispense Refill   aspirin 81 MG tablet Take 81 mg by mouth daily.     buPROPion (WELLBUTRIN SR) 100 MG 12 hr tablet Take 100 mg by mouth daily.   0   Multiple Vitamin (MULTIVITAMIN) tablet Take 1 tablet by  mouth daily.     Omega-3 Fatty Acids (FISH OIL OMEGA-3 PO) Take 1 tablet by mouth daily.     simvastatin (ZOCOR) 20 MG tablet Take 20 mg by mouth.     TURMERIC PO Take 1 tablet by mouth daily.     VITAMIN D, CHOLECALCIFEROL, PO Take 1 capsule by mouth daily.     metoprolol tartrate (LOPRESSOR) 100 MG tablet Take 100 mg 2 hours before Coronary CT 1 tablet 0   No current facility-administered medications for this visit.    Allergies:   Amoxicillin, Atorvastatin, Augmentin [amoxicillin-pot clavulanate], Paxil [paroxetine hcl], Serzone [nefazodone], Trintellix [vortioxetine], and Zoloft [sertraline hcl]    Social History:  The patient  reports that he has never smoked. He has never used smokeless tobacco. He reports current alcohol use. He reports that he does not use drugs.   Family History:  The patient's family history includes Gallbladder disease in his mother; Heart attack in his brother and paternal grandmother; Heart disease (age of onset: 65) in his father; Heart failure in his father; Hyperlipidemia in his brother, brother, and father; Hypertension in his brother; Liver cancer in his mother; Prostate cancer in his father; Stroke in his paternal grandfather.  ROS:  Please see the history of present illness.   Otherwise, review of systems are positive for none.   All other systems are reviewed and negative.    PHYSICAL EXAM: VS:  BP 130/84 (BP Location: Left Arm, Patient Position: Sitting, Cuff Size: Normal)    Pulse 72    Ht 5\' 7"  (1.702 m)    Wt 186 lb (84.4 kg)    BMI 29.13 kg/m  , BMI Body mass index is 29.13 kg/m. GEN: Well nourished, well developed, in no acute distress  HEENT: normal  Neck: no JVD, carotid bruits, or masses Cardiac: RRR; no murmurs, rubs, or gallops,no edema  Respiratory:  clear to auscultation bilaterally, normal work of breathing GI: soft, nontender, nondistended, + BS MS: no deformity or atrophy  Skin: warm and dry, no rash Neuro:  Strength and  sensation are intact Psych: euthymic mood, full affect   EKG:  EKG is ordered today. The ekg ordered today demonstrates NSR rate 72. Normal Ecg. I have personally reviewed and interpreted this study.    Recent Labs: No results found for requested labs within last 8760 hours.    Lipid Panel No results found for: CHOL, TRIG, HDL, CHOLHDL, VLDL, LDLCALC, LDLDIRECT   Lab dated 08/06/20: creatinine 1.21. ALT 26.  Labs dated 09/30/20: cholesterol 215, triglycerides 132, HDL 46, LDL 145. TSH Normal. BUN 11, creatinine 1.3. other chemistries normal. UA normal.   Wt Readings from Last 3 Encounters:  10/09/20 186 lb (84.4 kg)  04/10/18 171 lb (77.6 kg)  09/26/17 160 lb (72.6 kg)      Other studies Reviewed: Additional studies/ records that were reviewed today include: none Review of the above records demonstrates: N/A   ASSESSMENT AND PLAN:  1.  Chest pain with some atypical features. Risk factors of HLD and family history. Prior ETT in 2017 was OK but did show some ST changes. I would favor evaluating him now with Coronary CTA. Will pretreat with beta blocker.  2. HLD. CTA will inform us on the intensity of lipid lowering therapy. If he does have CAD would need more intensive statin therapy.    Current medicines are reviewed at length with the patient today.  The patient does not have concerns regarding medicines.  The following changes have been made:  no change  Labs/ tests ordered today include:   Orders Placed This Encounter  Procedures   CT CORONARY MORPH W/CTA COR W/SCORE W/CA W/CM &/OR WO/CM   CT CORONARY FRACTIONAL FLOW RESERVE DATA PREP   CT CORONARY FRACTIONAL FLOW RESERVE FLUID ANALYSIS   EKG 12-Lead     Disposition:   FU with me TBD based on CT  Signed, Leonard Cihlar Martinique, MD  10/09/2020 12:21 PM    [facility] [address], [place], [place], [address] Phone [phone], Fax [phone]

## 2020-10-09 ENCOUNTER — Ambulatory Visit (INDEPENDENT_AMBULATORY_CARE_PROVIDER_SITE_OTHER): Payer: Commercial Managed Care - PPO | Admitting: Cardiology

## 2020-10-09 ENCOUNTER — Other Ambulatory Visit: Payer: Self-pay

## 2020-10-09 ENCOUNTER — Encounter: Payer: Self-pay | Admitting: Cardiology

## 2020-10-09 VITALS — BP 130/84 | HR 72 | Ht 67.0 in | Wt 186.0 lb

## 2020-10-09 DIAGNOSIS — R072 Precordial pain: Secondary | ICD-10-CM | POA: Diagnosis not present

## 2020-10-09 DIAGNOSIS — E7800 Pure hypercholesterolemia, unspecified: Secondary | ICD-10-CM | POA: Diagnosis not present

## 2020-10-09 MED ORDER — METOPROLOL TARTRATE 100 MG PO TABS: ORAL_TABLET | ORAL | 0 refills | Status: DC

## 2020-10-09 NOTE — Patient Instructions (Addendum)
Medication Instructions:  Continue same medications *If you need a refill on your cardiac medications before your next appointment, please call your pharmacy*   Lab Work: None ordered   Testing/Procedures: Coronary CT to be scheduled after approved by insurance  Follow instructions below   Follow-Up: At [facility], you and your health needs are our priority.  As part of our continuing mission to provide you with exceptional heart care, we have created designated Provider Care Teams.  These Care Teams include your primary Cardiologist (physician) and Advanced Practice Providers (APPs -  Physician Assistants and Nurse Practitioners) who all work together to provide you with the care you need, when you need it.  We recommend signing up for the patient portal called "MyChart".  Sign up information is provided on this After Visit Summary.  MyChart is used to connect with patients for Virtual Visits (Telemedicine).  Patients are able to view lab/test results, encounter notes, upcoming appointments, etc.  Non-urgent messages can be sent to your provider as well.   To learn more about what you can do with MyChart, go to [email or web address].    Your next appointment:  To Be Determined after test   The format for your next appointment: Office   Provider: Dr.Jordan        Your cardiac CT will be scheduled at one of the below locations:   [facility] [address] [place], [place] [address] [phone]  [facility] [address] [place], [place] [address] [phone]  If scheduled at [facility], please arrive at the [facility] main entrance of [facility] 30 minutes prior to test start time. Proceed to the [facility] Radiology Department (first floor) to check-in and test prep.  If scheduled at [facility], please arrive 15 mins early for check-in and test  prep.  Please follow these instructions carefully (unless otherwise directed):  Hold all erectile dysfunction medications at least 3 days (72 hrs) prior to test.  On the Night Before the Test: . Be sure to Drink plenty of water. . Do not consume any caffeinated/decaffeinated beverages or chocolate 12 hours prior to your test. . Do not take any antihistamines 12 hours prior to your test.   On the Day of the Test: . Drink plenty of water. Do not drink any water within one hour of the test. . Do not eat any food 4 hours prior to the test. . You may take your regular medications prior to the test.  . Take metoprolol 100 mg two hours prior to test.          After the Test: . Drink plenty of water. . After receiving IV contrast, you may experience a mild flushed feeling. This is normal. . On occasion, you may experience a mild rash up to 24 hours after the test. This is not dangerous. If this occurs, you can take Benadryl 25 mg and increase your fluid intake. . If you experience trouble breathing, this can be serious. If it is severe call 911 IMMEDIATELY. If it is mild, please call our office.    Once we have confirmed authorization from your insurance company, we will call you to set up a date and time for your test. Based on how quickly your insurance processes prior authorizations requests, please allow up to 4 weeks to be contacted for scheduling your Cardiac CT appointment. Be advised that routine Cardiac CT appointments could be  scheduled as many as 8 weeks after your provider has ordered it.  For non-scheduling related questions, please contact the cardiac imaging nurse navigator should you have any questions/concerns: Marchia Bond, Cardiac Imaging Nurse Navigator Burley Saver, Interim Cardiac Imaging Nurse Agency and Vascular Services Direct Office Dial: [phone]   For scheduling needs, including cancellations and rescheduling, please call Vivien Rota at  [phone], option 3.

## 2020-10-24 LAB — COLOGUARD: COLOGUARD: NEGATIVE

## 2020-11-04 ENCOUNTER — Other Ambulatory Visit: Payer: Self-pay

## 2020-11-04 DIAGNOSIS — R072 Precordial pain: Secondary | ICD-10-CM

## 2020-11-04 DIAGNOSIS — Z01812 Encounter for preprocedural laboratory examination: Secondary | ICD-10-CM

## 2020-11-04 NOTE — Progress Notes (Signed)
bmet  

## 2020-11-07 LAB — BASIC METABOLIC PANEL
BUN/Creatinine Ratio: 14 (ref 10–24)
BUN: 16 mg/dL (ref 8–27)
CO2: 26 mmol/L (ref 20–29)
Calcium: 9.3 mg/dL (ref 8.6–10.2)
Chloride: 100 mmol/L (ref 96–106)
Creatinine, Ser: 1.18 mg/dL (ref 0.76–1.27)
GFR calc Af Amer: 77 mL/min/{1.73_m2} (ref >59)
GFR calc non Af Amer: 67 mL/min/{1.73_m2} (ref >59)
Glucose: 89 mg/dL (ref 65–99)
Potassium: 4.6 mmol/L (ref 3.5–5.2)
Sodium: 136 mmol/L (ref 134–144)

## 2020-11-12 ENCOUNTER — Other Ambulatory Visit: Payer: Self-pay

## 2020-11-12 ENCOUNTER — Telehealth (HOSPITAL_COMMUNITY): Payer: Self-pay | Admitting: Emergency Medicine

## 2020-11-12 MED ORDER — METOPROLOL TARTRATE 100 MG PO TABS: ORAL_TABLET | ORAL | 0 refills | Status: DC

## 2020-11-12 NOTE — Telephone Encounter (Signed)
vm box full and cannot leave a new message.  Marchia Bond RN Navigator Cardiac Imaging [facility] [phone] Office  [phone] Cell

## 2020-11-14 ENCOUNTER — Ambulatory Visit (HOSPITAL_COMMUNITY)
Admission: RE | Admit: 2020-11-14 | Discharge: 2020-11-14 | Disposition: A | Discharge status: Home / Self Care | Payer: Commercial Managed Care - PPO | Source: Ambulatory Visit | Attending: Cardiology | Admitting: Cardiology

## 2020-11-14 ENCOUNTER — Encounter (HOSPITAL_COMMUNITY): Payer: Self-pay

## 2020-11-14 ENCOUNTER — Other Ambulatory Visit: Payer: Self-pay

## 2020-11-14 ENCOUNTER — Encounter: Payer: Self-pay | Admitting: *Deleted

## 2020-11-14 DIAGNOSIS — R072 Precordial pain: Secondary | ICD-10-CM

## 2020-11-14 DIAGNOSIS — Z006 Encounter for examination for normal comparison and control in clinical research program: Secondary | ICD-10-CM

## 2020-11-14 MED ORDER — NITROGLYCERIN 0.4 MG SL SUBL
SUBLINGUAL_TABLET | SUBLINGUAL | Status: AC
  Filled 2020-11-14: qty 2

## 2020-11-14 MED ORDER — IOHEXOL 350 MG/ML SOLN
80.0000 mL | Freq: Once | INTRAVENOUS | Status: AC | PRN
  Administered 2020-11-14: 80 mL via INTRAVENOUS

## 2020-11-14 MED ORDER — NITROGLYCERIN 0.4 MG SL SUBL: 0.8000 mg | SUBLINGUAL_TABLET | Freq: Once | SUBLINGUAL | Status: DC

## 2020-11-14 NOTE — Research (Signed)
IDENTIFY Informed Consent                  Subject Name:   Leonard Cook   Subject met inclusion and exclusion criteria.  The informed consent form, study requirements and expectations were reviewed with the subject and questions and concerns were addressed prior to the signing of the consent form.  The subject verbalized understanding of the trial requirements.  The subject agreed to participate in the IDENTIFY trial and signed the informed consent.  The informed consent was obtained prior to performance of any protocol-specific procedures for the subject.  A copy of the signed informed consent was given to the subject and a copy was placed in the subject's medical record.   [place] Hermann Dottavio, Research Assistant  11/14/2020  08:23 a.m.

## 2020-11-15 DIAGNOSIS — R072 Precordial pain: Secondary | ICD-10-CM | POA: Diagnosis not present

## 2020-11-18 ENCOUNTER — Telehealth: Payer: Self-pay | Admitting: Cardiology

## 2020-11-18 MED ORDER — METOPROLOL SUCCINATE ER 25 MG PO TB24: 25.0000 mg | ORAL_TABLET | Freq: Every day | ORAL | 3 refills | Status: DC

## 2020-11-18 MED ORDER — ROSUVASTATIN CALCIUM 40 MG PO TABS: 40.0000 mg | ORAL_TABLET | Freq: Every day | ORAL | 3 refills | Status: AC

## 2020-11-18 NOTE — Telephone Encounter (Signed)
Patient calling about results

## 2020-11-18 NOTE — Telephone Encounter (Signed)
Spoke to patient coronary ct results and instructions given.

## 2020-11-24 NOTE — Progress Notes (Signed)
Cardiology Office Note   Date:  11/28/2020   ID:  Lyric, Hoar [birth year], MRN [id number]  PCP:  Deland Pretty, MD  Cardiologist:   Kairen Hallinan Martinique, MD   Chief Complaint  Patient presents with  . Coronary Artery Disease  . Chest Pain      History of Present Illness: Leonard Cook is a 59 y.o. male who is seen at the request of Dr Shelia Media for evaluation of chest pain. He has a history of HLD and obesity. I saw him remotely in 2016 for an abnormal Ecg. Ecg on our visit was normal. He had an ETT in November 2017 that showed excellent exercise tolerance with hypertensive response. There was ST elevation noted in lead V1 but since this was a single lead this was felt to be OK.  The patient reports that over a 6 week period he experienced chest pain in the left precordial area. It was 3-4/10 in severity and dull. He had radiation to the left arm. Pain would wax and wane sometimes lasting a day and a half. Some SOB. He did not clearly correlate this will activity. When we saw in the office he stated the symptoms were better. His father had CAD in his early 44s but lived to be 2.   We performed coronary CTA with results noted below. On follow up today he is seen with his wife. He is still experiencing pain in the left upper chest into his left shoulder and biceps. It is aching, heavy, sometimes cramping. It does not relate to activity. In fact he is able to ride a bike for 30 minutes and went hiking in the mountains this weekend without symptoms. He states he has a history of cervical spine issues on the right and this feels a lot like that.   Past Medical History:  Diagnosis Date  . Allergic rhinitis due to pollen   . Depressive disorder   . GERD (gastroesophageal reflux disease)   . Hyperlipidemia   . IBS (irritable bowel syndrome)   . Insomnia   . Migraine   . Obesity   . Onychomycosis   . Reflux   . Thyroiditis   . Trochanteric bursitis of right hip     Past Surgical  History:  Procedure Laterality Date  . CHOLECYSTECTOMY     age 65  . ESOPHAGOGASTRODUODENOSCOPY  1990  . KNEE ARTHROSCOPY WITH ANTERIOR CRUCIATE LIGAMENT (ACL) REPAIR  01/2016     Current Outpatient Medications  Medication Sig Dispense Refill  . aspirin 81 MG tablet Take 81 mg by mouth daily.    Marland Kitchen buPROPion (WELLBUTRIN SR) 100 MG 12 hr tablet Take 100 mg by mouth daily.   0  . metoprolol succinate (TOPROL XL) 25 MG 24 hr tablet Take 1 tablet (25 mg total) by mouth daily. 90 tablet 3  . metoprolol tartrate (LOPRESSOR) 100 MG tablet Take 100 mg 2 hours before Coronary CT 1 tablet 0  . Multiple Vitamin (MULTIVITAMIN) tablet Take 1 tablet by mouth daily.    . Omega-3 Fatty Acids (FISH OIL OMEGA-3 PO) Take 1 tablet by mouth daily.    . rosuvastatin (CRESTOR) 40 MG tablet Take 1 tablet (40 mg total) by mouth daily. 90 tablet 3  . TURMERIC PO Take 1 tablet by mouth daily.    Marland Kitchen VITAMIN D, CHOLECALCIFEROL, PO Take 1 capsule by mouth daily.     No current facility-administered medications for this visit.    Allergies:   Amoxicillin, Atorvastatin,  Augmentin [amoxicillin-pot clavulanate], Paxil [paroxetine hcl], Serzone [nefazodone], Trintellix [vortioxetine], and Zoloft [sertraline hcl]    Social History:  The patient  reports that he has never smoked. He has never used smokeless tobacco. He reports current alcohol use. He reports that he does not use drugs.   Family History:  The patient's family history includes Gallbladder disease in his mother; Heart attack in his brother and paternal grandmother; Heart disease (age of onset: 40) in his father; Heart failure in his father; Hyperlipidemia in his brother, brother, and father; Hypertension in his brother; Liver cancer in his mother; Prostate cancer in his father; Stroke in his paternal grandfather.    ROS:  Please see the history of present illness.   Otherwise, review of systems are positive for none.   All other systems are reviewed and  negative.    PHYSICAL EXAM: VS:  BP (!) 123/92   Pulse 66   Temp (!) 94.6 F (34.8 C)   Ht 5\' 7"  (1.702 m)   Wt 188 lb (85.3 kg)   SpO2 98%   BMI 29.44 kg/m  , BMI Body mass index is 29.44 kg/m. GEN: Well nourished, well developed, in no acute distress  HEENT: normal  Neck: no JVD, carotid bruits, or masses Cardiac: RRR; no murmurs, rubs, or gallops,no edema  Respiratory:  clear to auscultation bilaterally, normal work of breathing GI: soft, nontender, nondistended, + BS MS: no deformity or atrophy  Skin: warm and dry, no rash Neuro:  Strength and sensation are intact Psych: euthymic mood, full affect   EKG:  EKG is not ordered today.    Recent Labs: 11/07/2020: BUN 16; Creatinine, Ser 1.18; Potassium 4.6; Sodium 136    Lipid Panel No results found for: CHOL, TRIG, HDL, CHOLHDL, VLDL, LDLCALC, LDLDIRECT   Lab dated 08/06/20: creatinine 1.21. ALT 26.  Labs dated 09/30/20: cholesterol 215, triglycerides 132, HDL 46, LDL 145. TSH Normal. BUN 11, creatinine 1.3. other chemistries normal. UA normal.   Wt Readings from Last 3 Encounters:  11/28/20 188 lb (85.3 kg)  10/09/20 186 lb (84.4 kg)  04/10/18 171 lb (77.6 kg)      Other studies Reviewed: Additional studies/ records that were reviewed today include:   ADDENDUM REPORT: 11/14/2020 11:48  HISTORY: 59 yo male with chest pain, cardiac cause suspected  EXAM: Cardiac/Coronary CTA  TECHNIQUE: The patient was scanned on a [employer].  PROTOCOL: A 120 kV prospective scan was triggered in the descending thoracic aorta at 111 HU's. Axial non-contrast 3 mm slices were carried out through the heart. The data set was analyzed on a dedicated work station and scored using the [place]. Gantry rotation speed was 250 msecs and collimation was .6 mm. Beta blockade and 0.8 mg of sl NTG was given. The 3D data set was reconstructed in 5% intervals of the 67-82 % of the R-R cycle. Diastolic phases were  analyzed on a dedicated work station using MPR, MIP and VRT modes. The patient received 69mL OMNIPAQUE IOHEXOL 350 MG/ML SOLN of contrast.  FINDINGS: Quality: Excellent, HR 52  Coronary calcium score: The patient's coronary artery calcium score is 47, which places the patient in the 56th percentile. Calcium primarily in the proximal LAD and proximal LCx.  Coronary arteries: Normal coronary origins.  Right dominance.  Right Coronary Artery: Dominant.  Normal vessel without disease.  Left Main Coronary Artery: Normal. Bifurcates into the LAD and LCx arteries.  Left Anterior Descending Coronary Artery: Minimal 1-24% mixed proximal stenosis. Small  to moderate size D1 and D2 branches without stenosis.  Left Circumflex Artery: Smaller caliber AV vessel with mild 24-49% ostial stenosis (CADRADS2). There is a moderate to severe 50-69% (CADRADS3) non-calcified mid-vessel stenosis. Large high OM1 branch without disease.  Aorta: Normal size, 31 mm at the mid ascending aorta (level of the PA bifurcation) measured double oblique. No calcifications. No dissection.  Aortic Valve: Trileaflet.  No calcifications.  Other findings:  Normal pulmonary vein drainage into the left atrium.  Normal left atrial appendage without a thrombus.  Normal size of the pulmonary artery.  Sequelae of prior cholecystectomy (clips noted)  IMPRESSION: 1. Moderate to severe discrete non-calcified mid-LCx stenosis, CADRADS = 3. Otherwise minimal mixed proximal LAD and LCx stenoses. Will send for FFR.  2. Coronary calcium score of 47. This was 56th percentile for age and sex matched control.  3. Normal coronary origin with right dominance.  4. Aggressive risk factor modifcation recommended.   Electronically Signed   By: Pixie Casino M.D.   On: 11/14/2020 11:48   CT FFR ANALYSIS  CLINICAL DATA:  Chest pain, cardiac cause suspected  FINDINGS: FFRct analysis was  performed on the original cardiac CT angiogram dataset. Diagrammatic representation of the FFRct analysis is provided in a separate PDF document in PACS. This dictation was created using the PDF document and an interactive 3D model of the results. 3D model is not available in the EMR/PACS. Normal FFR range is >0.80.  1. Left Main:  No significant stenosis. FFR = 0.99  2. LAD: No significant stenosis. Proximal FFR = 0.98, Mid FFR = 0.94, Distal FFR = 0.86 3. LCX: No significant stenosis. Proximal FFR = 0.98, Distal FFR = Not modeled 4. RCA: No significant stenosis. Proximal FFR = 0.99, Mid FFR = 0.94, Distal FFR = 0.92  IMPRESSION: 1.  CT FFR analysis didn't show any significant stenosis.  2. The mid to distal LCx was not modeled, therefore, stenosis cannot be excluded.  3. Clinical correlation advised to determine if further evaluation is warranted.   Electronically Signed   By: Pixie Casino M.D.   On: 11/15/2020 12:39    ASSESSMENT AND PLAN:  1.  Chest pain with  atypical features. Risk factors of HLD and family history. Prior ETT in 2017 was OK but did show some ST changes. Coronary CTA showed moderate stenosis in the mid LCx. Unfortunately could not model for FFR. No other significant disease. So he has at worst single vessel disease in the mid LCx. This is overall low risk. We recommended continuing ASA 81 mg daily. Simvastatin switched to Crestor 40 mg daily. Added Toprol XL 25 mg daily. I encouraged him to follow up with Dr Onnie Graham to see if current symptoms could be more radicular in origin. Will follow up in 4 months. I would not pursue cardiac cath unless there is a significant change in his symptoms.  2. HLD. Given CAD noted on CTA recommend goal LDL < 70. Now on Crestor 40 mg daily. Repeat lipids in 2 months. Discussed heart healthy eating and regular aerobic exercise.     Current medicines are reviewed at length with the patient today.  The patient does  not have concerns regarding medicines.  The following changes have been made: see above.   Labs/ tests ordered today include:   Orders Placed This Encounter  Procedures  . Basic metabolic panel  . Lipid panel  . Hepatic function panel     Disposition:   FU with me 4  weeks.  Signed, Emilyanne Mcgough Martinique, MD  11/28/2020 5:27 PM    [facility] [address] [place], [place], [address] Phone [phone], Fax [phone]

## 2020-11-28 ENCOUNTER — Other Ambulatory Visit: Payer: Self-pay

## 2020-11-28 ENCOUNTER — Ambulatory Visit: Payer: Commercial Managed Care - PPO | Admitting: Cardiology

## 2020-11-28 ENCOUNTER — Encounter: Payer: Self-pay | Admitting: Cardiology

## 2020-11-28 VITALS — BP 123/92 | HR 66 | Temp 94.6°F | Ht 67.0 in | Wt 188.0 lb

## 2020-11-28 DIAGNOSIS — I25118 Atherosclerotic heart disease of native coronary artery with other forms of angina pectoris: Secondary | ICD-10-CM | POA: Diagnosis not present

## 2020-11-28 DIAGNOSIS — E7800 Pure hypercholesterolemia, unspecified: Secondary | ICD-10-CM | POA: Diagnosis not present

## 2020-11-28 NOTE — Patient Instructions (Signed)
Medication Instructions:  Continue same medications *If you need a refill on your cardiac medications before your next appointment, please call your pharmacy*   Lab Work: Bmet,lipid and hepatic panels in 2 months  February   Lab order enclosed    Testing/Procedures: None ordered   Follow-Up: At [facility], you and your health needs are our priority.  As part of our continuing mission to provide you with exceptional heart care, we have created designated Provider Care Teams.  These Care Teams include your primary Cardiologist (physician) and Advanced Practice Providers (APPs -  Physician Assistants and Nurse Practitioners) who all work together to provide you with the care you need, when you need it.  We recommend signing up for the patient portal called "MyChart".  Sign up information is provided on this After Visit Summary.  MyChart is used to connect with patients for Virtual Visits (Telemedicine).  Patients are able to view lab/test results, encounter notes, upcoming appointments, etc.  Non-urgent messages can be sent to your provider as well.   To learn more about what you can do with MyChart, go to [email or web address].    Your next appointment: 4 months   Friday 04/03/21 at 4:20 pm   The format for your next appointment:  Office    Provider:  Dr.Jordan

## 2020-12-23 ENCOUNTER — Telehealth: Payer: Self-pay | Admitting: Cardiology

## 2020-12-23 NOTE — Telephone Encounter (Signed)
*  STAT* If patient is at the pharmacy, call can be transferred to refill team.   1. Which medications need to be refilled? (please list name of each medication and dose if known)   rosuvastatin (CRESTOR) 40 MG tablet [[id number] metoprolol succinate (TOPROL XL) 25 MG 24 hr tablet    2. Which pharmacy/location (including street and city if local pharmacy) is medication to be sent to? [facility]  Address: [address], [place], [place] [address] Phone: [phone]  3. Do they need a 30 day or 90 day supply? Pt is out of town and forgot his meds.  He need a small supply sent to this Pharmacy.  He is needing a 10 day supply.

## 2021-03-26 ENCOUNTER — Ambulatory Visit: Payer: Commercial Managed Care - PPO | Admitting: Cardiology

## 2021-03-26 ENCOUNTER — Telehealth: Payer: Self-pay

## 2021-03-26 DIAGNOSIS — Z006 Encounter for examination for normal comparison and control in clinical research program: Secondary | ICD-10-CM

## 2021-03-26 NOTE — Telephone Encounter (Signed)
I called patient for his 11 day phone call follow up for the Identify Study. I left a message for patient to return my call and I followed up with an e-mail.

## 2021-03-27 NOTE — Progress Notes (Signed)
Cardiology Office Note   Date:  04/02/2021   ID:  Pepe, Mineau [birth year], MRN [id number]  PCP:  Deland Pretty, MD  Cardiologist:   Jaquilla Woodroof Martin[place], MD   Chief Complaint  Patient presents with  . Coronary Artery Disease      History of Present Illness: Leonard Cook is a 60 y.o. male who is seen for follow up CAD.  He has a history of HLD and obesity. I saw him remotely in 2016 for an abnormal Ecg. Ecg on our visit was normal. He had an ETT in November 2017 that showed excellent exercise tolerance with hypertensive response. There was ST elevation noted in lead V1 but since this was a single lead this was felt to be OK.  He was seen for some chest discomfort and SOB. He did not clearly correlate this will activity. When we saw in the office he stated the symptoms were better. His father had CAD in his early 5s but lived to be 26.   We performed coronary CTA with results noted below. When seen in follow up his symptoms were fairly atypical.  He is still experiencing pain in the left upper chest into his left shoulder and biceps. It is aching, heavy, sometimes cramping. It does not relate to activity. In fact he is able to ride a bike for 30 minutes and hiking in the mountains  without symptoms. He states he has a history of cervical spine issues on the right and this feels a lot like that.   On follow up today he continues to do well. He is active and has noted no exertional symptoms. He still has occasional generalized tightness in his upper chest and arms at times. This is typically when he is fatigued from travel or not sleeping well. He is tolerating his medication well.   Past Medical History:  Diagnosis Date  . Allergic rhinitis due to pollen   . Depressive disorder   . GERD (gastroesophageal reflux disease)   . Hyperlipidemia   . IBS (irritable bowel syndrome)   . Insomnia   . Migraine   . Obesity   . Onychomycosis   . Reflux   . Thyroiditis   . Trochanteric  bursitis of right hip     Past Surgical History:  Procedure Laterality Date  . CHOLECYSTECTOMY     age 11  . ESOPHAGOGASTRODUODENOSCOPY  1990  . KNEE ARTHROSCOPY WITH ANTERIOR CRUCIATE LIGAMENT (ACL) REPAIR  01/2016     Current Outpatient Medications  Medication Sig Dispense Refill  . aspirin 81 MG tablet Take 81 mg by mouth daily.    Marland Kitchen buPROPion (WELLBUTRIN SR) 100 MG 12 hr tablet Take 100 mg by mouth daily.   0  . metoprolol succinate (TOPROL XL) 25 MG 24 hr tablet Take 1 tablet (25 mg total) by mouth daily. 90 tablet 3  . Multiple Vitamin (MULTIVITAMIN) tablet Take 1 tablet by mouth daily.    . Omega-3 Fatty Acids (FISH OIL OMEGA-3 PO) Take 1 tablet by mouth daily.    . rosuvastatin (CRESTOR) 40 MG tablet Take 1 tablet (40 mg total) by mouth daily. 90 tablet 3  . TURMERIC PO Take 1 tablet by mouth daily.    Marland Kitchen VITAMIN D, CHOLECALCIFEROL, PO Take 1 capsule by mouth daily.     No current facility-administered medications for this visit.    Allergies:   Amoxicillin, Atorvastatin, Augmentin [amoxicillin-pot clavulanate], Paxil [paroxetine hcl], Serzone [nefazodone], Trintellix [vortioxetine], and Zoloft [sertraline hcl]  Social History:  The patient  reports that he has never smoked. He has never used smokeless tobacco. He reports current alcohol use. He reports that he does not use drugs.   Family History:  The patient's family history includes Gallbladder disease in his mother; Heart attack in his brother and paternal grandmother; Heart disease (age of onset: 20) in his father; Heart failure in his father; Hyperlipidemia in his brother, brother, and father; Hypertension in his brother; Liver cancer in his mother; Prostate cancer in his father; Stroke in his paternal grandfather.    ROS:  Please see the history of present illness.   Otherwise, review of systems are positive for none.   All other systems are reviewed and negative.    PHYSICAL EXAM: VS:  BP 132/78 (BP Location:  Left Arm, Patient Position: Sitting)   Pulse 70   Ht 5\' 7"  (1.702 m)   Wt 190 lb 12.8 oz (86.5 kg)   SpO2 97%   BMI 29.88 kg/m  , BMI Body mass index is 29.88 kg/m. GEN: Well nourished, well developed, in no acute distress  HEENT: normal  Neck: no JVD, carotid bruits, or masses Cardiac: RRR; no murmurs, rubs, or gallops,no edema  Respiratory:  clear to auscultation bilaterally, normal work of breathing GI: soft, nontender, nondistended, + BS MS: no deformity or atrophy  Skin: warm and dry, no rash Neuro:  Strength and sensation are intact Psych: euthymic mood, full affect   EKG:  EKG is not ordered today.    Recent Labs: 11/07/2020: BUN 16; Creatinine, Ser 1.18; Potassium 4.6; Sodium 136    Lipid Panel No results found for: CHOL, TRIG, HDL, CHOLHDL, VLDL, LDLCALC, LDLDIRECT   Lab dated 08/06/20: creatinine 1.21. ALT 26.  Labs dated 09/30/20: cholesterol 215, triglycerides 132, HDL 46, LDL 145. TSH Normal. BUN 11, creatinine 1.3. other chemistries normal. UA normal.   Wt Readings from Last 3 Encounters:  04/02/21 190 lb 12.8 oz (86.5 kg)  11/28/20 188 lb (85.3 kg)  10/09/20 186 lb (84.4 kg)      Other studies Reviewed: Additional studies/ records that were reviewed today include:   ADDENDUM REPORT: 11/14/2020 11:48  HISTORY: 60 yo male with chest pain, cardiac cause suspected  EXAM: Cardiac/Coronary CTA  TECHNIQUE: The patient was scanned on a [employer].  PROTOCOL: A 120 kV prospective scan was triggered in the descending thoracic aorta at 111 HU's. Axial non-contrast 3 mm slices were carried out through the heart. The data set was analyzed on a dedicated work station and scored using the [place]. Gantry rotation speed was 250 msecs and collimation was .6 mm. Beta blockade and 0.8 mg of sl NTG was given. The 3D data set was reconstructed in 5% intervals of the 67-82 % of the R-R cycle. Diastolic phases were analyzed on a dedicated  work station using MPR, MIP and VRT modes. The patient received 61mL OMNIPAQUE IOHEXOL 350 MG/ML SOLN of contrast.  FINDINGS: Quality: Excellent, HR 52  Coronary calcium score: The patient's coronary artery calcium score is 47, which places the patient in the 56th percentile. Calcium primarily in the proximal LAD and proximal LCx.  Coronary arteries: Normal coronary origins.  Right dominance.  Right Coronary Artery: Dominant.  Normal vessel without disease.  Left Main Coronary Artery: Normal. Bifurcates into the LAD and LCx arteries.  Left Anterior Descending Coronary Artery: Minimal 1-24% mixed proximal stenosis. Small to moderate size D1 and D2 branches without stenosis.  Left Circumflex Artery: Smaller caliber  AV vessel with mild 24-49% ostial stenosis (CADRADS2). There is a moderate to severe 50-69% (CADRADS3) non-calcified mid-vessel stenosis. Large high OM1 branch without disease.  Aorta: Normal size, 31 mm at the mid ascending aorta (level of the PA bifurcation) measured double oblique. No calcifications. No dissection.  Aortic Valve: Trileaflet.  No calcifications.  Other findings:  Normal pulmonary vein drainage into the left atrium.  Normal left atrial appendage without a thrombus.  Normal size of the pulmonary artery.  Sequelae of prior cholecystectomy (clips noted)  IMPRESSION: 1. Moderate to severe discrete non-calcified mid-LCx stenosis, CADRADS = 3. Otherwise minimal mixed proximal LAD and LCx stenoses. Will send for FFR.  2. Coronary calcium score of 47. This was 56th percentile for age and sex matched control.  3. Normal coronary origin with right dominance.  4. Aggressive risk factor modifcation recommended.   Electronically Signed   By: Pixie Casino M.D.   On: 11/14/2020 11:48   CT FFR ANALYSIS  CLINICAL DATA:  Chest pain, cardiac cause suspected  FINDINGS: FFRct analysis was performed on the original  cardiac CT angiogram dataset. Diagrammatic representation of the FFRct analysis is provided in a separate PDF document in PACS. This dictation was created using the PDF document and an interactive 3D model of the results. 3D model is not available in the EMR/PACS. Normal FFR range is >0.80.  1. Left Main:  No significant stenosis. FFR = 0.99  2. LAD: No significant stenosis. Proximal FFR = 0.98, Mid FFR = 0.94, Distal FFR = 0.86 3. LCX: No significant stenosis. Proximal FFR = 0.98, Distal FFR = Not modeled 4. RCA: No significant stenosis. Proximal FFR = 0.99, Mid FFR = 0.94, Distal FFR = 0.92  IMPRESSION: 1.  CT FFR analysis didn't show any significant stenosis.  2. The mid to distal LCx was not modeled, therefore, stenosis cannot be excluded.  3. Clinical correlation advised to determine if further evaluation is warranted.   Electronically Signed   By: Pixie Casino M.D.   On: 11/15/2020 12:39    ASSESSMENT AND PLAN:  1.  Chest pain with  atypical features. Risk factors of HLD and family history. Prior ETT in 2017 was OK but did show some ST changes. Coronary CTA showed moderate stenosis in the mid LCx. No other significant disease so this  is overall low risk. Recommended continuing ASA 81 mg daily. On Crestor 40 mg daily and  Toprol XL 25 mg daily. Continue to encourage healthy diet and regular aerobic exercise. Will follow up in 6 months.  2. HLD. Given CAD noted on CTA recommend goal LDL < 70. Now on Crestor 40 mg daily. He reports he had lab work yesterday. Will follow up on results.  Discussed heart healthy eating and regular aerobic exercise.      Disposition:   FU with me 6 months  Signed, Declyn Delsol Martinique, MD  04/02/2021 4:52 PM    [facility] [address], [place], [place], [address] Phone [phone], Fax [phone]

## 2021-04-02 ENCOUNTER — Encounter: Payer: Self-pay | Admitting: Cardiology

## 2021-04-02 ENCOUNTER — Ambulatory Visit (INDEPENDENT_AMBULATORY_CARE_PROVIDER_SITE_OTHER): Payer: Commercial Managed Care - PPO | Admitting: Cardiology

## 2021-04-02 ENCOUNTER — Other Ambulatory Visit: Payer: Self-pay

## 2021-04-02 VITALS — BP 132/78 | HR 70 | Ht 67.0 in | Wt 190.8 lb

## 2021-04-02 DIAGNOSIS — I25118 Atherosclerotic heart disease of native coronary artery with other forms of angina pectoris: Secondary | ICD-10-CM | POA: Diagnosis not present

## 2021-04-02 DIAGNOSIS — E7800 Pure hypercholesterolemia, unspecified: Secondary | ICD-10-CM

## 2021-04-03 ENCOUNTER — Ambulatory Visit: Payer: Commercial Managed Care - PPO | Admitting: Cardiology

## 2021-04-08 ENCOUNTER — Ambulatory Visit: Payer: Commercial Managed Care - PPO | Admitting: Cardiology

## 2021-05-07 ENCOUNTER — Other Ambulatory Visit: Payer: Self-pay

## 2021-05-07 ENCOUNTER — Ambulatory Visit: Payer: Self-pay

## 2021-05-07 ENCOUNTER — Ambulatory Visit: Payer: Commercial Managed Care - PPO | Admitting: Family Medicine

## 2021-05-07 ENCOUNTER — Encounter: Payer: Self-pay | Admitting: Family Medicine

## 2021-05-07 VITALS — BP 122/82 | HR 67 | Ht 67.0 in | Wt 190.2 lb

## 2021-05-07 DIAGNOSIS — M25521 Pain in right elbow: Secondary | ICD-10-CM

## 2021-05-07 DIAGNOSIS — M7711 Lateral epicondylitis, right elbow: Secondary | ICD-10-CM

## 2021-05-07 MED ORDER — NITROGLYCERIN 0.2 MG/HR TD PT24: MEDICATED_PATCH | TRANSDERMAL | 1 refills | Status: DC

## 2021-05-07 NOTE — Progress Notes (Signed)
    I, [name], LAT, ATC, am serving as scribe for Dr. Lynne Leader.  Subjective:    I'm seeing this patient as a consultation for: Dr. Deland Pretty. Note will be routed back to referring provider/PCP.  CC: Right elbow pain  HPI: Pt is a 60 y/o male c/o R elbow pain since late Jan 2022 after he got Covid. Pt locates pain to his R lateral epicondyle.  He describes his pain as burning.  Radiates: No Numbness/tingling: No Aggravates: gripping; driving; pressure to the area Treatments tried: stretching; hot tub; prednisone; counterforce strap;   Dx imaging: 04/04/07 C-spine XR  Past medical history, Surgical history, Family history, Social history, Allergies, and medications have been entered into the medical record, reviewed.   Review of Systems: No new headache, visual changes, nausea, vomiting, diarrhea, constipation, dizziness, abdominal pain, skin rash, fevers, chills, night sweats, weight loss, swollen lymph nodes, body aches, joint swelling, muscle aches, chest pain, shortness of breath, mood changes, visual or auditory hallucinations.   Objective:    Vitals:   05/07/21 1555  BP: 122/82  Pulse: 67  SpO2: 97%   General: Well Developed, well nourished, and in no acute distress.  Neuro/Psych: Alert and oriented x3, extra-ocular muscles intact, able to move all 4 extremities, sensation grossly intact. Skin: Warm and dry, no rashes noted.  Respiratory: Not using accessory muscles, speaking in full sentences, trachea midline.  Cardiovascular: Pulses palpable, no extremity edema. Abdomen: Does not appear distended. MSK: Right elbow normal-appearing normal motion.  Normal elbow strength. Tender palpation lateral epicondyle Pain with resisted wrist extension. Grip strength intact. Pulses capillary refill and sensation are intact distally.  Lab and Radiology Results Diagnostic Limited MSK Ultrasound of: Right elbow lateral epicondyle Intact common tensor tendon insertion. Small  bony avulsion fleck fragment present superficial consistent with lateral epicondylitis. No tear visible Impression: Lateral epicondylitis   Impression and Recommendations:    Assessment and Plan: 60 y.o. male with right elbow lateral epicondylitis.  This is been ongoing since January 2022.  Plan for home exercise program taught in clinic today by myself and ATC.  Additionally use nitroglycerin patch protocol.  Continue counterforce bracing as needed.  Recheck in 6 weeks.Marland Kitchen  PDMP not reviewed this encounter. Orders Placed This Encounter  Procedures  . [place] LIMITED JOINT SPACE STRUCTURES UP RIGHT(NO LINKED CHARGES)    Order Specific Question:   Reason for Exam (SYMPTOM  OR DIAGNOSIS REQUIRED)    Answer:   R elbow pain    Order Specific Question:   Preferred imaging location?    Answer:   [facility]   Meds ordered this encounter  Medications  . nitroGLYCERIN (NITRODUR - DOSED IN MG/24 HR) 0.2 mg/hr patch    Sig: Apply 1/4 patch daily to tendon for tendonitis.    Dispense:  30 patch    Refill:  1    Discussed warning signs or symptoms. Please see discharge instructions. Patient expresses understanding.   The above documentation has been reviewed and is accurate and complete Lynne Leader, M.D.

## 2021-05-07 NOTE — Patient Instructions (Addendum)
Nice to meet you.  Theraband flexbar.   Please perform the exercise program that we have prepared for you and gone over in detail on a daily basis.  In addition to the handout you were provided you can access your program through: [email or web address]   Your unique program code is: [email or web address]  Allow use the TheraBand Flexbar to focus on eccentric strengthening for your forearm extensors.  You can take a look at this YouTube video explaining how to do the flexbar exercise: [email or web address]  Nitroglycerin Protocol   Apply 1/4 nitroglycerin patch to affected area daily.  Change position of patch within the affected area every 24 hours.  You may experience a headache during the first 1-2 weeks of using the patch, these should subside.  If you experience headaches after beginning nitroglycerin patch treatment, you may take your preferred over the counter pain reliever.  Another side effect of the nitroglycerin patch is skin irritation or rash related to patch adhesive.  Please notify our office if you develop more severe headaches or rash, and stop the patch.  Tendon healing with nitroglycerin patch may require 12 to 24 weeks depending on the extent of injury.  Men should not use if taking Viagra, Cialis, or Levitra.   Do not use if you have migraines or rosacea.   Recheck in about 6 weeks.   Let me know sooner if this is not working.  We can do more.

## 2021-05-18 ENCOUNTER — Telehealth: Payer: Self-pay

## 2021-05-18 NOTE — Telephone Encounter (Signed)
Called patient left message on personal voice mail Dr.Jordan reviewed lab from PCP.Advised to call me back to discuss.

## 2021-05-20 NOTE — Telephone Encounter (Signed)
PT CALLING BACK TO DISCUSS LABS.

## 2021-05-22 ENCOUNTER — Telehealth: Payer: Self-pay

## 2021-05-22 NOTE — Telephone Encounter (Signed)
Called patient left message on personal voice mail Dr.Jordan advised ok to take crestor as prescribed by Dr.Pharr.Advised LDL should be 70 or below.Advised to repeat fasting lipid panel in 3 months.

## 2021-05-26 ENCOUNTER — Telehealth: Payer: Self-pay

## 2021-05-26 DIAGNOSIS — Z006 Encounter for examination for normal comparison and control in clinical research program: Secondary | ICD-10-CM

## 2021-05-26 NOTE — Telephone Encounter (Signed)
I have attempted without success to contact this patient by phone for his Identify 90 day follow up phone call. I left a message for patient to return my phone call with my name and callback number. An e-mail was also sent to patient.   

## 2021-06-10 NOTE — Progress Notes (Signed)
   I, [name], LAT, ATC, am serving as scribe for Dr. Lynne Leader.  Leonard Cook is a 60 y.o. male who presents to [facility] today for f/u of R lateral epicondylitis.  He was last seen by Dr. Georgina Snell on 05/07/21 and was shown a HEP focusing on forearm extensor eccentric strengthening and was advised to use nitroglycerin patches.  Since his last visit, pt reports elbow pain is about the same.  Pt reports he feels like he strained his elbow a few time and has been doing more around the house. Pt has been compliant w/ HEP and using the nitro patches. Pt locates pain to lateral aspect of forearm, more over the muscle. No numbness/tingling noted.   Pertinent review of systems: No fevers or chills  Relevant historical information: Hypercholesterolemia   Exam:  BP 118/82 (BP Location: Right Arm, Patient Position: Sitting, Cuff Size: Normal)   Pulse 63   Wt 189 lb 3.2 oz (85.8 kg)   SpO2 98%   BMI 29.63 kg/m  General: Well Developed, well nourished, and in no acute distress.   MSK: Right elbow normal-appearing Tender palpation lateral epicondyle.  Pain with resisted wrist extension.    Lab and Radiology Results  X-ray images right elbow obtained today personally and independently interpreted No acute fractures.  No significant degenerative changes. Await formal radiology review    Assessment and Plan: 60 y.o. male with right elbow pain due to lateral epicondylitis.  Symptoms ongoing since January 2022.  Patient has had trials of home exercise conservative management since early May with nitroglycerin patch protocol.  He was doing well and then had an exacerbation when he tried to return to tennis I suspect a little too soon.  Discussed options.  Plan to resume home exercise program for another 3 to 4 weeks.  If not improving or if worsening next step would be MRI to further characterize cause of pain and for potential PRP injection planning.   Additionally this may be for surgical planning.  Discussed steroid injections.  Steroid injections are not well supported by the evidence for lateral epicondylitis.  However this could be an option in the future as well.   PDMP not reviewed this encounter. Orders Placed This Encounter  Procedures   DG ELBOW COMPLETE RIGHT (3+VIEW)    Standing Status:   Future    Number of Occurrences:   1    Standing Expiration Date:   06/11/2022    Order Specific Question:   Reason for Exam (SYMPTOM  OR DIAGNOSIS REQUIRED)    Answer:   eval elbow pain    Order Specific Question:   Preferred imaging location?    Answer:   [facility]   No orders of the defined types were placed in this encounter.    Discussed warning signs or symptoms. Please see discharge instructions. Patient expresses understanding.   The above documentation has been reviewed and is accurate and complete Lynne Leader, M.D.

## 2021-06-11 ENCOUNTER — Ambulatory Visit (INDEPENDENT_AMBULATORY_CARE_PROVIDER_SITE_OTHER): Payer: Commercial Managed Care - PPO | Admitting: Family Medicine

## 2021-06-11 ENCOUNTER — Ambulatory Visit (INDEPENDENT_AMBULATORY_CARE_PROVIDER_SITE_OTHER): Payer: Commercial Managed Care - PPO

## 2021-06-11 ENCOUNTER — Other Ambulatory Visit: Payer: Self-pay

## 2021-06-11 VITALS — BP 118/82 | HR 63 | Wt 189.2 lb

## 2021-06-11 DIAGNOSIS — M7711 Lateral epicondylitis, right elbow: Secondary | ICD-10-CM

## 2021-06-11 DIAGNOSIS — M25521 Pain in right elbow: Secondary | ICD-10-CM

## 2021-06-11 NOTE — Patient Instructions (Signed)
Thank you for coming in today.   Please get an Xray today before you leave   Resume home exercises.  Let me know in a few weeks how you feel. If not better or if worsening I will order MRI.   Keep me updated.

## 2021-06-11 NOTE — Progress Notes (Signed)
Right elbow looks normal to radiology

## 2021-10-13 DIAGNOSIS — M722 Plantar fascial fibromatosis: Secondary | ICD-10-CM | POA: Insufficient documentation

## 2021-12-29 DIAGNOSIS — Z0000 Encounter for general adult medical examination without abnormal findings: Secondary | ICD-10-CM | POA: Diagnosis not present

## 2021-12-29 DIAGNOSIS — Z125 Encounter for screening for malignant neoplasm of prostate: Secondary | ICD-10-CM | POA: Diagnosis not present

## 2022-02-10 DIAGNOSIS — H1045 Other chronic allergic conjunctivitis: Secondary | ICD-10-CM | POA: Diagnosis not present

## 2022-02-10 DIAGNOSIS — H47321 Drusen of optic disc, right eye: Secondary | ICD-10-CM | POA: Diagnosis not present

## 2022-02-10 DIAGNOSIS — H2513 Age-related nuclear cataract, bilateral: Secondary | ICD-10-CM | POA: Diagnosis not present

## 2022-02-10 DIAGNOSIS — H04123 Dry eye syndrome of bilateral lacrimal glands: Secondary | ICD-10-CM | POA: Diagnosis not present
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
## 2022-02-23 DIAGNOSIS — L718 Other rosacea: Secondary | ICD-10-CM | POA: Diagnosis not present

## 2022-02-23 DIAGNOSIS — L821 Other seborrheic keratosis: Secondary | ICD-10-CM | POA: Diagnosis not present

## 2022-02-23 DIAGNOSIS — L738 Other specified follicular disorders: Secondary | ICD-10-CM | POA: Diagnosis not present

## 2022-02-23 DIAGNOSIS — L570 Actinic keratosis: Secondary | ICD-10-CM | POA: Diagnosis not present

## 2022-02-24 ENCOUNTER — Encounter: Payer: Self-pay | Admitting: Cardiology

## 2022-02-24 ENCOUNTER — Other Ambulatory Visit: Payer: Self-pay

## 2022-02-24 ENCOUNTER — Ambulatory Visit (INDEPENDENT_AMBULATORY_CARE_PROVIDER_SITE_OTHER): Payer: BC Managed Care – PPO | Admitting: Cardiology

## 2022-02-24 VITALS — BP 112/78 | HR 58 | Ht 67.0 in | Wt 188.0 lb

## 2022-02-24 DIAGNOSIS — I25118 Atherosclerotic heart disease of native coronary artery with other forms of angina pectoris: Secondary | ICD-10-CM

## 2022-02-24 DIAGNOSIS — E7800 Pure hypercholesterolemia, unspecified: Secondary | ICD-10-CM

## 2022-02-25 ENCOUNTER — Telehealth: Payer: Self-pay

## 2022-02-25 DIAGNOSIS — E7800 Pure hypercholesterolemia, unspecified: Secondary | ICD-10-CM

## 2022-02-25 DIAGNOSIS — I25118 Atherosclerotic heart disease of native coronary artery with other forms of angina pectoris: Secondary | ICD-10-CM

## 2022-02-25 NOTE — Telephone Encounter (Signed)
Called patient left message on personal voice mail Dr.Jordan reviewed labs from PCP.LDL 83.He advised better but still not at goal.He advised to see our pharmacist in Lipid clinic.Scheduler will call back with appointment. ?

## 2022-03-23 ENCOUNTER — Ambulatory Visit: Payer: BC Managed Care – PPO | Admitting: Pharmacist

## 2022-03-23 ENCOUNTER — Telehealth: Payer: Self-pay | Admitting: Pharmacist

## 2022-03-23 ENCOUNTER — Encounter: Payer: Self-pay | Admitting: Pharmacist

## 2022-03-23 ENCOUNTER — Other Ambulatory Visit: Payer: Self-pay

## 2022-03-23 VITALS — BP 132/86 | HR 64 | Resp 17 | Ht 67.0 in | Wt 190.2 lb

## 2022-03-23 DIAGNOSIS — R209 Unspecified disturbances of skin sensation: Secondary | ICD-10-CM | POA: Insufficient documentation

## 2022-03-23 DIAGNOSIS — I2510 Atherosclerotic heart disease of native coronary artery without angina pectoris: Secondary | ICD-10-CM | POA: Diagnosis not present

## 2022-03-23 DIAGNOSIS — K4090 Unilateral inguinal hernia, without obstruction or gangrene, not specified as recurrent: Secondary | ICD-10-CM | POA: Insufficient documentation

## 2022-03-23 DIAGNOSIS — Z8639 Personal history of other endocrine, nutritional and metabolic disease: Secondary | ICD-10-CM | POA: Insufficient documentation

## 2022-03-23 DIAGNOSIS — L219 Seborrheic dermatitis, unspecified: Secondary | ICD-10-CM | POA: Insufficient documentation

## 2022-03-23 DIAGNOSIS — R931 Abnormal findings on diagnostic imaging of heart and coronary circulation: Secondary | ICD-10-CM | POA: Diagnosis not present

## 2022-03-23 DIAGNOSIS — E7800 Pure hypercholesterolemia, unspecified: Secondary | ICD-10-CM

## 2022-03-23 DIAGNOSIS — Z8659 Personal history of other mental and behavioral disorders: Secondary | ICD-10-CM | POA: Insufficient documentation

## 2022-03-23 DIAGNOSIS — Z8781 Personal history of (healed) traumatic fracture: Secondary | ICD-10-CM | POA: Insufficient documentation

## 2022-03-23 DIAGNOSIS — B351 Tinea unguium: Secondary | ICD-10-CM | POA: Insufficient documentation

## 2022-03-23 DIAGNOSIS — J309 Allergic rhinitis, unspecified: Secondary | ICD-10-CM | POA: Insufficient documentation

## 2022-03-23 DIAGNOSIS — F339 Major depressive disorder, recurrent, unspecified: Secondary | ICD-10-CM | POA: Insufficient documentation

## 2022-03-23 DIAGNOSIS — K589 Irritable bowel syndrome without diarrhea: Secondary | ICD-10-CM | POA: Insufficient documentation

## 2022-03-23 DIAGNOSIS — G4700 Insomnia, unspecified: Secondary | ICD-10-CM | POA: Insufficient documentation

## 2022-03-23 MED ORDER — REPATHA SURECLICK 140 MG/ML ~~LOC~~ SOAJ: 140.0000 mg | SUBCUTANEOUS | 11 refills | Status: AC

## 2022-03-23 NOTE — Patient Instructions (Addendum)
It was nice meeting you today ? ?We would like your LDL (bad cholesterol) to be less than 70 ? ?Please continue your rosuvastatin '40mg'$  daily ? ?We will start a new medication called Repatha which is one injection every 2 weeks ? ?We will complete the prior authorization for you and contact you when it is approved.  We will also activate a copay card for you ? ?Try to increase your exercise up to 30 minutes a day at least 5 days a week ? ?Please call with any questions ? ?Karren Cobble, PharmD, BCACP, [place], CPP ?[address], [address] ?[place], [place], [address] ?Phone: [phone], Fax: [phone]  ?

## 2022-03-23 NOTE — Telephone Encounter (Signed)
CALLED AND RELAYED THEY FOLLOWING INFORMATION TO THE PHARMACY AND THEY VOICED UNDERSTANDING.  ? ?PA APPROVED FOR [place]  ? ?Lipid panel ordered and released  ? ?Rx sent instructed pt to complete fasting lipid panel post 4th dose.  ? ?Copay card is as follows: ?RxBin: [id number] RxPCN: [id number] RxGrp: [id number] ID: [id number] ?

## 2022-03-23 NOTE — Telephone Encounter (Signed)
Please complete prior authorization for: ? ?Name of medication, dose, and frequency repatha '140mg'$  sq q 14 days or Praluent 75 mg sq q 14 days ? ?Lab Orders Requested? yes ? ?Which labs? Lipid panel ? ?Estimated date for labs to be scheduled 2-3 months ? ?Does patient need activated copay card? YES  ?

## 2022-03-23 NOTE — Progress Notes (Signed)
Patient ID: Leonard Cook                 DOB: Oct 10, 1961                    MRN: [id number] ? ? ? ? ?HPI: ?Leonard Cook is a 61 y.o. male patient referred to lipid clinic by Dr Martinique. PMH is significant for HLD, obesity and elevated coronary calcium score.  Currently managed on rosuvastatin and Zetia. ? ?Patient presents today in good spirits. Reports last LDL from PCP office was 83 but can not find results in Epic or [place]. Was intolerant to atorvastatin but can tolerate high dose rosuvastatin and ezetimibe.   ? ?Is physically active playing tennis and riding his bike although he admits he could be more active. Is working on diet changes.  Reports a family history of CAD. ? ?Current Medications:  ?Rosuvastatin '40mg'$  daily ?Zetia '10mg'$  daily  ? ?Intolerances:  ?Atorvastatin ? ?Risk Factors:  ?Elevated coronary calcium score ?Family history ? ?LDL goal: <70 ? ?Labs: ?Coronary calcium score of 47. This was 56th percentile for age and sex matched control. ? ?HDL 49, LDL 152, Trigs 120, TC 156 (12/29/21 on Crestor 40 and Zetia 10) ? ? ?Past Medical History:  ?Diagnosis Date  ? Allergic rhinitis due to pollen   ? Depressive disorder   ? GERD (gastroesophageal reflux disease)   ? Hyperlipidemia   ? IBS (irritable bowel syndrome)   ? Insomnia   ? Migraine   ? Obesity   ? Onychomycosis   ? Reflux   ? Thyroiditis   ? Trochanteric bursitis of right hip   ? ? ?Current Outpatient Medications on File Prior to Visit  ?Medication Sig Dispense Refill  ? aspirin 81 MG tablet Take 81 mg by mouth daily.    ? buPROPion (WELLBUTRIN SR) 100 MG 12 hr tablet Take 100 mg by mouth daily.   0  ? ezetimibe (ZETIA) 10 MG tablet Take 10 mg by mouth daily.    ? FLUoxetine (PROZAC) 10 MG capsule Take 10 mg by mouth every morning.    ? Multiple Vitamin (MULTIVITAMIN) tablet Take 1 tablet by mouth daily.    ? Omega-3 Fatty Acids (FISH OIL OMEGA-3 PO) Take 1 tablet by mouth daily.    ? rosuvastatin (CRESTOR) 40 MG tablet Take 1 tablet (40 mg total) by  mouth daily. 90 tablet 3  ? TURMERIC PO Take 1 tablet by mouth daily.    ? VITAMIN D, CHOLECALCIFEROL, PO Take 1 capsule by mouth daily.    ? ?No current facility-administered medications on file prior to visit.  ? ? ?Allergies  ?Allergen Reactions  ? Amoxicillin Other (See Comments)  ?  Fever, fatigue  ? Atorvastatin Other (See Comments)  ?  myalgia  ? Augmentin [Amoxicillin-Pot Clavulanate] Other (See Comments)  ?  myalgia  ? Paxil [Paroxetine Hcl] Diarrhea  ? Serzone [Nefazodone]   ?  Feels spacey  ? Trintellix [Vortioxetine] Other (See Comments)  ?  Severe up and downs, lightheaded and jittery  ? Zoloft [Sertraline Hcl] Diarrhea  ? ? ?Assessment/Plan: ? ?1. Hyperlipidemia - Patient most recent LDL of 83 is above goal of <70. Last documented LDL I could find was 152 however this was 12 months ago.  Since patient currently on high intensity statin and Zetia, will need PCSK9i to reach lipid goal especially due to elevated coronary calcium score. ? ?Using demo pen, educated patient on mechanism of action, storage, site  selection, administration, and possible adverse effects. Patient voiced understanding. Will complete PA and activate copay card. Recheck lipid panel in 2-3 months. Advised may be able to d/c Zetia at that time. ? ?Printed planning healthy meals handout and gave to patient. ? ?Continue rosuvastatin '40mg'$  daily ?Continue Zetia '10mg'$  daily ?Start Repatha/praluent sq q 14 day ?Recheck lipid panel in 2-3 months ? ?Karren Cobble, PharmD, BCACP, [place], CPP ?[address], [address] ?[place], [place], [address] ?Phone: [phone], Fax: [phone]  ? ?

## 2022-04-28 ENCOUNTER — Encounter: Payer: Self-pay | Admitting: Pharmacist

## 2022-04-28 ENCOUNTER — Encounter: Payer: Self-pay | Admitting: Cardiology

## 2022-04-29 ENCOUNTER — Other Ambulatory Visit: Payer: Self-pay

## 2022-04-29 DIAGNOSIS — I25118 Atherosclerotic heart disease of native coronary artery with other forms of angina pectoris: Secondary | ICD-10-CM

## 2022-04-29 DIAGNOSIS — E7800 Pure hypercholesterolemia, unspecified: Secondary | ICD-10-CM

## 2022-04-29 NOTE — Telephone Encounter (Signed)
OK to try his approach and recheck fasting labs with his visit in July ? ?Sherisa Gilvin Martinique MD, [facility] ? ?

## 2022-06-28 DIAGNOSIS — Z0000 Encounter for general adult medical examination without abnormal findings: Secondary | ICD-10-CM | POA: Diagnosis not present

## 2022-06-28 DIAGNOSIS — Z125 Encounter for screening for malignant neoplasm of prostate: Secondary | ICD-10-CM | POA: Diagnosis not present

## 2022-07-05 DIAGNOSIS — Z0000 Encounter for general adult medical examination without abnormal findings: Secondary | ICD-10-CM | POA: Diagnosis not present

## 2022-07-05 DIAGNOSIS — F339 Major depressive disorder, recurrent, unspecified: Secondary | ICD-10-CM | POA: Diagnosis not present

## 2022-07-05 DIAGNOSIS — Z23 Encounter for immunization: Secondary | ICD-10-CM | POA: Diagnosis not present

## 2022-07-05 DIAGNOSIS — I2510 Atherosclerotic heart disease of native coronary artery without angina pectoris: Secondary | ICD-10-CM | POA: Diagnosis not present

## 2022-07-05 DIAGNOSIS — Z1212 Encounter for screening for malignant neoplasm of rectum: Secondary | ICD-10-CM | POA: Diagnosis not present

## 2022-07-05 DIAGNOSIS — K219 Gastro-esophageal reflux disease without esophagitis: Secondary | ICD-10-CM | POA: Diagnosis not present
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
## 2022-07-27 ENCOUNTER — Ambulatory Visit: Payer: BC Managed Care – PPO | Admitting: Cardiology

## 2022-08-09 DIAGNOSIS — M25511 Pain in right shoulder: Secondary | ICD-10-CM | POA: Diagnosis not present

## 2022-08-23 DIAGNOSIS — K635 Polyp of colon: Secondary | ICD-10-CM | POA: Diagnosis not present

## 2022-08-23 DIAGNOSIS — Z1211 Encounter for screening for malignant neoplasm of colon: Secondary | ICD-10-CM | POA: Diagnosis not present

## 2022-08-23 DIAGNOSIS — K219 Gastro-esophageal reflux disease without esophagitis: Secondary | ICD-10-CM | POA: Diagnosis not present

## 2022-08-23 DIAGNOSIS — K5140 Inflammatory polyps of colon without complications: Secondary | ICD-10-CM | POA: Diagnosis not present

## 2022-08-23 DIAGNOSIS — D123 Benign neoplasm of transverse colon: Secondary | ICD-10-CM | POA: Diagnosis not present

## 2022-08-23 DIAGNOSIS — K2100 Gastro-esophageal reflux disease with esophagitis, without bleeding: Secondary | ICD-10-CM | POA: Diagnosis not present

## 2022-08-26 DIAGNOSIS — H52223 Regular astigmatism, bilateral: Secondary | ICD-10-CM | POA: Diagnosis not present

## 2022-08-26 DIAGNOSIS — H47321 Drusen of optic disc, right eye: Secondary | ICD-10-CM | POA: Diagnosis not present

## 2022-08-26 DIAGNOSIS — H524 Presbyopia: Secondary | ICD-10-CM | POA: Diagnosis not present

## 2022-08-26 DIAGNOSIS — H2513 Age-related nuclear cataract, bilateral: Secondary | ICD-10-CM | POA: Diagnosis not present

## 2022-08-26 DIAGNOSIS — H5203 Hypermetropia, bilateral: Secondary | ICD-10-CM | POA: Diagnosis not present

## 2022-08-26 DIAGNOSIS — H04123 Dry eye syndrome of bilateral lacrimal glands: Secondary | ICD-10-CM | POA: Diagnosis not present

## 2022-08-26 DIAGNOSIS — H43393 Other vitreous opacities, bilateral: Secondary | ICD-10-CM | POA: Diagnosis not present

## 2022-09-01 DIAGNOSIS — M25511 Pain in right shoulder: Secondary | ICD-10-CM | POA: Diagnosis not present

## 2022-09-08 DIAGNOSIS — R1013 Epigastric pain: Secondary | ICD-10-CM | POA: Diagnosis not present

## 2022-09-08 DIAGNOSIS — M7541 Impingement syndrome of right shoulder: Secondary | ICD-10-CM | POA: Diagnosis not present

## 2022-09-29 NOTE — Progress Notes (Signed)
Cardiology Office Note   Date:  10/01/2022   ID:  Leonard Cook, Leonard Cook 1961-02-02, MRN [id number]  PCP:  Deland Pretty, MD  Cardiologist:   Nishant Schrecengost Martinique, MD   Chief Complaint  Patient presents with   Follow-up   Coronary Artery Disease      History of Present Illness: Leonard Cook is a 61 y.o. male who is seen for follow up CAD.  He has a history of HLD and obesity. I saw him remotely in 2016 for an abnormal Ecg. Ecg on our visit was normal. He had an ETT in November 2017 that showed excellent exercise tolerance with hypertensive response. There was ST elevation noted in lead V1 but since this was a single lead this was felt to be OK.  His father had CAD in his early 36s but lived to be 84.   We performed coronary CTA in Nov 2021 which suggested significant stenosis in the mid to distal LCx. Small caliber vessel. FFR was overall normal but apparently the distal LCx was not modeled. When seen in follow up his symptoms were fairly atypical.  He is still experiencing pain in the left upper chest into his left shoulder and biceps. It is aching, heavy, sometimes cramping. It does not relate to activity. In fact he is able to ride a bike for 30 minutes and hiking in the mountains  without symptoms. He states he has a history of cervical spine issues on the right and this feels a lot like that.   After our last visit he was seen by our Pharm D for lipid management. Recommended starting Repatha but patient wanted to try lifestyle modification first before committing to new therapy.  He really did great with lifestyle modification. Lost 8 lbs. Was exercising regularly and eating well. LDL came down to 53. Since then he has taken a new job with [employer]. Oldest daughter just started college. 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 Has gained 5 lbs back. No chest pain.    Past Medical History:  Diagnosis Date   Allergic rhinitis due to pollen    Depressive disorder    GERD  (gastroesophageal reflux disease)    Hyperlipidemia    IBS (irritable bowel syndrome)    Insomnia    Migraine    Obesity    Onychomycosis    Reflux    Thyroiditis    Trochanteric bursitis of right hip     Past Surgical History:  Procedure Laterality Date   CHOLECYSTECTOMY     age 56   ESOPHAGOGASTRODUODENOSCOPY  89   KNEE ARTHROSCOPY WITH ANTERIOR CRUCIATE LIGAMENT (ACL) REPAIR  01/2016     Current Outpatient Medications  Medication Sig Dispense Refill   aspirin 81 MG tablet Take 81 mg by mouth daily.     buPROPion (WELLBUTRIN SR) 100 MG 12 hr tablet Take 100 mg by mouth daily.   0   ezetimibe (ZETIA) 10 MG tablet Take 10 mg by mouth daily.     FLUoxetine (PROZAC) 10 MG capsule Take 10 mg by mouth every morning.     Multiple Vitamin (MULTIVITAMIN) tablet Take 1 tablet by mouth daily.     Omega-3 Fatty Acids (FISH OIL OMEGA-3 PO) Take 1 tablet by mouth daily.     rosuvastatin (CRESTOR) 40 MG tablet Take 1 tablet (40 mg total) by mouth daily. 90 tablet 3   TURMERIC PO Take 1 tablet by mouth daily.     VITAMIN D, CHOLECALCIFEROL, PO  Take 1 capsule by mouth daily.     Evolocumab (REPATHA SURECLICK) 202 MG/ML SOAJ Inject 140 mg into the skin every 14 (fourteen) days. (Patient not taking: Reported on 10/01/2022) 2 mL 11   No current facility-administered medications for this visit.    Allergies:   Amoxicillin, Atorvastatin, Augmentin [amoxicillin-pot clavulanate], Paxil [paroxetine hcl], Serzone [nefazodone], Trintellix [vortioxetine], and Zoloft [sertraline hcl]    Social History:  The patient  reports that he has never smoked. He has never used smokeless tobacco. He reports current alcohol use. He reports that he does not use drugs.   Family History:  The patient's family history includes Gallbladder disease in his mother; Heart attack in his brother and paternal grandmother; Heart disease (age of onset: 94) in his father; Heart failure in his father; Hyperlipidemia in his  brother, brother, and father; Hypertension in his brother; Liver cancer in his mother; Prostate cancer in his father; Stroke in his paternal grandfather.    ROS:  Please see the history of present illness.   Otherwise, review of systems are positive for none.   All other systems are reviewed and negative.    PHYSICAL EXAM: VS:  BP 114/64 (BP Location: Left Arm, Patient Position: Sitting, Cuff Size: Normal)   Pulse 68   Ht '5\' 7"'$  (1.702 m)   Wt 189 lb (85.7 kg)   BMI 29.60 kg/m  , BMI Body mass index is 29.6 kg/m. GEN: Well nourished, well developed, in no acute distress  HEENT: normal  Neck: no JVD, carotid bruits, or masses Cardiac: RRR; no murmurs, rubs, or gallops,no edema  Respiratory:  clear to auscultation bilaterally, normal work of breathing GI: soft, nontender, nondistended, + BS MS: no deformity or atrophy  Skin: warm and dry, no rash Neuro:  Strength and sensation are intact Psych: euthymic mood, full affect   EKG:  EKG is not  ordered today.      Recent Labs: No results found for requested labs within last 365 days.    Lipid Panel No results found for: "CHOL", "TRIG", "HDL", "CHOLHDL", "VLDL", "LDLCALC", "LDLDIRECT"   Lab dated 08/06/20: creatinine 1.21. ALT 26.  Labs dated 09/30/20: cholesterol 215, triglycerides 132, HDL 46, LDL 145. TSH Normal. BUN 11, creatinine 1.3. other chemistries normal. UA normal.  Dated 04/02/21: cholesterol 229, triglycerides 175, HDL 45, LDL 152.  Dated 06/28/22: cholesterol 116, triglycerides 91, HDL 45, LDL 53. UA, CMET, CBC normal.   Wt Readings from Last 3 Encounters:  10/01/22 189 lb (85.7 kg)  03/23/22 190 lb 3.2 oz (86.3 kg)  02/24/22 188 lb (85.3 kg)      Other studies Reviewed: Additional studies/ records that were reviewed today include:   ADDENDUM REPORT: 11/14/2020 11:48   HISTORY: 61 yo male with chest pain, cardiac cause suspected   EXAM: Cardiac/Coronary CTA   TECHNIQUE: The patient was scanned on a  [employer].   PROTOCOL: A 120 kV prospective scan was triggered in the descending thoracic aorta at 111 HU's. Axial non-contrast 3 mm slices were carried out through the heart. The data set was analyzed on a dedicated work station and scored using the [place]. Gantry rotation speed was 250 msecs and collimation was .6 mm. Beta blockade and 0.8 mg of sl NTG was given. The 3D data set was reconstructed in 5% intervals of the 67-82 % of the R-R cycle. Diastolic phases were analyzed on a dedicated work station using MPR, MIP and VRT modes. The patient received 34m OMNIPAQUE IOHEXOL  350 MG/ML SOLN of contrast.   FINDINGS: Quality: Excellent, HR 52   Coronary calcium score: The patient's coronary artery calcium score is 47, which places the patient in the 56th percentile. Calcium primarily in the proximal LAD and proximal LCx.   Coronary arteries: Normal coronary origins.  Right dominance.   Right Coronary Artery: Dominant.  Normal vessel without disease.   Left Main Coronary Artery: Normal. Bifurcates into the LAD and LCx arteries.   Left Anterior Descending Coronary Artery: Minimal 1-24% mixed proximal stenosis. Small to moderate size D1 and D2 branches without stenosis.   Left Circumflex Artery: Smaller caliber AV vessel with mild 24-49% ostial stenosis (CADRADS2). There is a moderate to severe 50-69% (CADRADS3) non-calcified mid-vessel stenosis. Large high OM1 branch without disease.   Aorta: Normal size, 31 mm at the mid ascending aorta (level of the PA bifurcation) measured double oblique. No calcifications. No dissection.   Aortic Valve: Trileaflet.  No calcifications.   Other findings:   Normal pulmonary vein drainage into the left atrium.   Normal left atrial appendage without a thrombus.   Normal size of the pulmonary artery.   Sequelae of prior cholecystectomy (clips noted)   IMPRESSION: 1. Moderate to severe discrete non-calcified mid-LCx  stenosis, CADRADS = 3. Otherwise minimal mixed proximal LAD and LCx stenoses. Will send for FFR.   2. Coronary calcium score of 47. This was 56th percentile for age and sex matched control.   3. Normal coronary origin with right dominance.   4. Aggressive risk factor modifcation recommended.     Electronically Signed   By: Pixie Casino M.D.   On: 11/14/2020 11:48    CT FFR ANALYSIS   CLINICAL DATA:  Chest pain, cardiac cause suspected   FINDINGS: FFRct analysis was performed on the original cardiac CT angiogram dataset. Diagrammatic representation of the FFRct analysis is provided in a separate PDF document in PACS. This dictation was created using the PDF document and an interactive 3D model of the results. 3D model is not available in the EMR/PACS. Normal FFR range is >0.80.   1. Left Main:  No significant stenosis. FFR = 0.99   2. LAD: No significant stenosis. Proximal FFR = 0.98, Mid FFR = 0.94, Distal FFR = 0.86 3. LCX: No significant stenosis. Proximal FFR = 0.98, Distal FFR = Not modeled 4. RCA: No significant stenosis. Proximal FFR = 0.99, Mid FFR = 0.94, Distal FFR = 0.92   IMPRESSION: 1.  CT FFR analysis didn't show any significant stenosis.   2. The mid to distal LCx was not modeled, therefore, stenosis cannot be excluded.   3. Clinical correlation advised to determine if further evaluation is warranted.     Electronically Signed   By: Pixie Casino M.D.   On: 11/15/2020 12:39     ASSESSMENT AND PLAN:  1.  CAD.  Risk factors of HLD and family history. Prior ETT in 2017 was OK but did show some ST changes. Coronary CTA showed moderate stenosis in the mid- distal LCx- small caliber vessel. No other significant disease so this is overall low risk. No significant angina. Recommended continuing ASA 81 mg daily. On Crestor 40 mg daily and  Zetia. Continue to encourage healthy diet and regular aerobic exercise. Will follow up in one year.  2. HLD.  Given CAD noted on CTA recommend goal LDL < 70. Now on Crestor 40 mg daily and Zetia. Did very well with lifestyle modification and was able to reach target. Reinforced importance  of lifestyle changes.      Disposition:   FU with me one year  Signed, Hazel Leveille Martinique, MD  10/01/2022 11:44 AM    [facility] [address], [place], [place], [address] Phone [phone], Fax [phone]

## 2022-10-01 ENCOUNTER — Encounter: Payer: Self-pay | Admitting: Cardiology

## 2022-10-01 ENCOUNTER — Ambulatory Visit: Payer: BC Managed Care – PPO | Attending: Cardiology | Admitting: Cardiology

## 2022-10-01 VITALS — BP 114/64 | HR 68 | Ht 67.0 in | Wt 189.0 lb

## 2022-10-01 DIAGNOSIS — E7800 Pure hypercholesterolemia, unspecified: Secondary | ICD-10-CM

## 2022-10-01 DIAGNOSIS — I25118 Atherosclerotic heart disease of native coronary artery with other forms of angina pectoris: Secondary | ICD-10-CM

## 2022-10-07 ENCOUNTER — Encounter: Payer: Self-pay | Admitting: Cardiology

## 2022-10-13 DIAGNOSIS — Z23 Encounter for immunization: Secondary | ICD-10-CM | POA: Diagnosis not present

## 2022-11-10 DIAGNOSIS — Z23 Encounter for immunization: Secondary | ICD-10-CM | POA: Diagnosis not present

## 2023-02-24 DIAGNOSIS — H43393 Other vitreous opacities, bilateral: Secondary | ICD-10-CM | POA: Diagnosis not present

## 2023-02-24 DIAGNOSIS — H2513 Age-related nuclear cataract, bilateral: Secondary | ICD-10-CM | POA: Diagnosis not present

## 2023-02-24 DIAGNOSIS — H47321 Drusen of optic disc, right eye: Secondary | ICD-10-CM | POA: Diagnosis not present

## 2023-02-25 DIAGNOSIS — M75101 Unspecified rotator cuff tear or rupture of right shoulder, not specified as traumatic: Secondary | ICD-10-CM | POA: Diagnosis not present

## 2023-04-08 DIAGNOSIS — G8918 Other acute postprocedural pain: Secondary | ICD-10-CM | POA: Diagnosis not present

## 2023-04-08 DIAGNOSIS — Z967 Presence of other bone and tendon implants: Secondary | ICD-10-CM | POA: Diagnosis not present

## 2023-04-08 DIAGNOSIS — M25511 Pain in right shoulder: Secondary | ICD-10-CM | POA: Diagnosis not present

## 2023-04-08 DIAGNOSIS — Z4789 Encounter for other orthopedic aftercare: Secondary | ICD-10-CM | POA: Diagnosis not present

## 2023-04-08 DIAGNOSIS — S43431A Superior glenoid labrum lesion of right shoulder, initial encounter: Secondary | ICD-10-CM | POA: Diagnosis not present

## 2023-04-08 DIAGNOSIS — M19011 Primary osteoarthritis, right shoulder: Secondary | ICD-10-CM | POA: Diagnosis not present

## 2023-04-08 DIAGNOSIS — I10 Essential (primary) hypertension: Secondary | ICD-10-CM | POA: Diagnosis not present

## 2023-04-08 DIAGNOSIS — M7541 Impingement syndrome of right shoulder: Secondary | ICD-10-CM | POA: Diagnosis not present

## 2023-04-08 DIAGNOSIS — M75111 Incomplete rotator cuff tear or rupture of right shoulder, not specified as traumatic: Secondary | ICD-10-CM | POA: Diagnosis not present

## 2023-04-08 DIAGNOSIS — Z6830 Body mass index (BMI) 30.0-30.9, adult: Secondary | ICD-10-CM | POA: Diagnosis not present

## 2023-04-08 DIAGNOSIS — I890 Lymphedema, not elsewhere classified: Secondary | ICD-10-CM | POA: Diagnosis not present

## 2023-04-17 DIAGNOSIS — M19011 Primary osteoarthritis, right shoulder: Secondary | ICD-10-CM | POA: Diagnosis not present

## 2023-04-17 DIAGNOSIS — Z4789 Encounter for other orthopedic aftercare: Secondary | ICD-10-CM | POA: Diagnosis not present

## 2023-04-17 DIAGNOSIS — M25511 Pain in right shoulder: Secondary | ICD-10-CM | POA: Diagnosis not present

## 2023-04-17 DIAGNOSIS — I890 Lymphedema, not elsewhere classified: Secondary | ICD-10-CM | POA: Diagnosis not present

## 2023-04-18 DIAGNOSIS — Z4789 Encounter for other orthopedic aftercare: Secondary | ICD-10-CM | POA: Diagnosis not present

## 2023-04-18 DIAGNOSIS — M19011 Primary osteoarthritis, right shoulder: Secondary | ICD-10-CM | POA: Diagnosis not present

## 2023-04-18 DIAGNOSIS — M25511 Pain in right shoulder: Secondary | ICD-10-CM | POA: Diagnosis not present

## 2023-04-18 DIAGNOSIS — I890 Lymphedema, not elsewhere classified: Secondary | ICD-10-CM | POA: Diagnosis not present

## 2023-04-21 DIAGNOSIS — Z967 Presence of other bone and tendon implants: Secondary | ICD-10-CM | POA: Diagnosis not present

## 2023-04-21 DIAGNOSIS — M25511 Pain in right shoulder: Secondary | ICD-10-CM | POA: Diagnosis not present

## 2023-04-21 DIAGNOSIS — Z4789 Encounter for other orthopedic aftercare: Secondary | ICD-10-CM | POA: Diagnosis not present

## 2023-04-21 DIAGNOSIS — I890 Lymphedema, not elsewhere classified: Secondary | ICD-10-CM | POA: Diagnosis not present

## 2023-04-21 DIAGNOSIS — M19011 Primary osteoarthritis, right shoulder: Secondary | ICD-10-CM | POA: Diagnosis not present

## 2023-04-29 DIAGNOSIS — M25511 Pain in right shoulder: Secondary | ICD-10-CM | POA: Diagnosis not present

## 2023-04-30 DIAGNOSIS — M19011 Primary osteoarthritis, right shoulder: Secondary | ICD-10-CM | POA: Diagnosis not present

## 2023-04-30 DIAGNOSIS — I890 Lymphedema, not elsewhere classified: Secondary | ICD-10-CM | POA: Diagnosis not present

## 2023-04-30 DIAGNOSIS — Z4789 Encounter for other orthopedic aftercare: Secondary | ICD-10-CM | POA: Diagnosis not present

## 2023-04-30 DIAGNOSIS — M25511 Pain in right shoulder: Secondary | ICD-10-CM | POA: Diagnosis not present

## 2023-05-01 DIAGNOSIS — I890 Lymphedema, not elsewhere classified: Secondary | ICD-10-CM | POA: Diagnosis not present

## 2023-05-01 DIAGNOSIS — M19011 Primary osteoarthritis, right shoulder: Secondary | ICD-10-CM | POA: Diagnosis not present

## 2023-05-01 DIAGNOSIS — M25511 Pain in right shoulder: Secondary | ICD-10-CM | POA: Diagnosis not present

## 2023-05-01 DIAGNOSIS — Z4789 Encounter for other orthopedic aftercare: Secondary | ICD-10-CM | POA: Diagnosis not present

## 2023-05-02 DIAGNOSIS — M25511 Pain in right shoulder: Secondary | ICD-10-CM | POA: Diagnosis not present

## 2023-05-09 DIAGNOSIS — M25511 Pain in right shoulder: Secondary | ICD-10-CM | POA: Diagnosis not present

## 2023-05-16 DIAGNOSIS — M25511 Pain in right shoulder: Secondary | ICD-10-CM | POA: Diagnosis not present

## 2023-05-20 DIAGNOSIS — M25511 Pain in right shoulder: Secondary | ICD-10-CM | POA: Diagnosis not present

## 2023-05-25 DIAGNOSIS — M25511 Pain in right shoulder: Secondary | ICD-10-CM | POA: Diagnosis not present

## 2023-05-27 DIAGNOSIS — M25511 Pain in right shoulder: Secondary | ICD-10-CM | POA: Diagnosis not present

## 2023-06-02 ENCOUNTER — Other Ambulatory Visit: Payer: Self-pay

## 2023-06-02 ENCOUNTER — Emergency Department (HOSPITAL_COMMUNITY)
Admission: EM | Admit: 2023-06-02 | Discharge: 2023-06-02 | Disposition: A | Discharge status: Home / Self Care | Payer: BC Managed Care – PPO | Attending: Emergency Medicine | Admitting: Emergency Medicine

## 2023-06-02 ENCOUNTER — Emergency Department (HOSPITAL_COMMUNITY): Payer: BC Managed Care – PPO

## 2023-06-02 ENCOUNTER — Encounter (HOSPITAL_COMMUNITY): Payer: Self-pay

## 2023-06-02 DIAGNOSIS — I2510 Atherosclerotic heart disease of native coronary artery without angina pectoris: Secondary | ICD-10-CM | POA: Insufficient documentation

## 2023-06-02 DIAGNOSIS — R079 Chest pain, unspecified: Secondary | ICD-10-CM | POA: Diagnosis not present

## 2023-06-02 DIAGNOSIS — U071 COVID-19: Secondary | ICD-10-CM | POA: Insufficient documentation

## 2023-06-02 DIAGNOSIS — R509 Fever, unspecified: Secondary | ICD-10-CM | POA: Diagnosis not present

## 2023-06-02 DIAGNOSIS — R0602 Shortness of breath: Secondary | ICD-10-CM | POA: Diagnosis not present

## 2023-06-02 DIAGNOSIS — R0789 Other chest pain: Secondary | ICD-10-CM | POA: Diagnosis not present

## 2023-06-02 DIAGNOSIS — R051 Acute cough: Secondary | ICD-10-CM | POA: Diagnosis not present

## 2023-06-02 LAB — CBC
HCT: 42 % (ref 39.0–52.0)
Hemoglobin: 14.1 g/dL (ref 13.0–17.0)
MCH: 31.2 pg (ref 26.0–34.0)
MCHC: 33.6 g/dL (ref 30.0–36.0)
MCV: 92.9 fL (ref 80.0–100.0)
Platelets: 351 10*3/uL (ref 150–400)
RBC: 4.52 MIL/uL (ref 4.22–5.81)
RDW: 12.5 % (ref 11.5–15.5)
WBC: 16.1 10*3/uL — ABNORMAL HIGH (ref 4.0–10.5)
nRBC: 0 % (ref 0.0–0.2)

## 2023-06-02 LAB — RESP PANEL BY RT-PCR (RSV, FLU A&B, COVID)  RVPGX2
Influenza A by PCR: NEGATIVE
Influenza B by PCR: NEGATIVE
Resp Syncytial Virus by PCR: NEGATIVE
SARS Coronavirus 2 by RT PCR: POSITIVE — AB

## 2023-06-02 LAB — TROPONIN I (HIGH SENSITIVITY)
Troponin I (High Sensitivity): 6 ng/L (ref <18)
Troponin I (High Sensitivity): 6 ng/L (ref <18)

## 2023-06-02 LAB — BASIC METABOLIC PANEL
Anion gap: 17 — ABNORMAL HIGH (ref 5–15)
BUN: 12 mg/dL (ref 8–23)
CO2: 21 mmol/L — ABNORMAL LOW (ref 22–32)
Calcium: 10 mg/dL (ref 8.9–10.3)
Chloride: 94 mmol/L — ABNORMAL LOW (ref 98–111)
Creatinine, Ser: 1.15 mg/dL (ref 0.61–1.24)
GFR, Estimated: >60 mL/min (ref >60)
Glucose, Bld: 141 mg/dL — ABNORMAL HIGH (ref 70–99)
Potassium: 4.2 mmol/L (ref 3.5–5.1)
Sodium: 132 mmol/L — ABNORMAL LOW (ref 135–145)

## 2023-06-02 MED ORDER — ONDANSETRON HCL 4 MG PO TABS: 4.0000 mg | ORAL_TABLET | Freq: Four times a day (QID) | ORAL | 0 refills | Status: AC

## 2023-06-02 MED ORDER — KETOROLAC TROMETHAMINE 15 MG/ML IJ SOLN
15.0000 mg | Freq: Once | INTRAMUSCULAR | Status: AC
  Administered 2023-06-02: 15 mg via INTRAMUSCULAR
  Filled 2023-06-02: qty 1

## 2023-06-02 MED ORDER — ONDANSETRON 4 MG PO TBDP
4.0000 mg | ORAL_TABLET | Freq: Once | ORAL | Status: AC
  Administered 2023-06-02: 4 mg via ORAL
  Filled 2023-06-02: qty 1

## 2023-06-02 MED ORDER — PAXLOVID (300/100) 20 X 150 MG & 10 X 100MG PO TBPK: 3.0000 | ORAL_TABLET | Freq: Two times a day (BID) | ORAL | 0 refills | Status: AC

## 2023-06-02 NOTE — ED Triage Notes (Signed)
Pt came in via POV d/t CP/SOB that started last night. [facility]. Reports he began having chest tightness across his mid chest area, soreness in his rib muscle areas on both sides making it hard to take a deep breath. Also having nausea from some phlegm/saliva when he coughs, a HA & aches/pains mainly in his shoulders, also intermittent fevers. A/Ox4, rates his overall pain 7/10.

## 2023-06-02 NOTE — ED Provider Triage Note (Signed)
Emergency Medicine Provider Triage Evaluation Note  Leonard Cook , a 62 y.o. male  was evaluated in triage.  Pt complains of chest tightness, SHOB. Onset last night after eating dinner, noticed he had general discomfort/aching in his chest, worse with breathing. Symptoms progressively worsening, with fever and congestion. Home COVID negative. Recently traveled to [place] [facility], no specific known sick contacts. No lower extremity edema. No personal or family hx of PE/DVT, no history of cancer, non smoker.   Review of Systems  Positive:  Negative:   Physical Exam  BP (!) 147/95 (BP Location: Right Arm)   Pulse 89   Temp 100 F (37.8 C) (Oral)   Resp 16   SpO2 99%  Gen:   Awake, no distress   Resp:  Normal effort  MSK:   Moves extremities without difficulty  Other:  Appears uncomfortable   Medical Decision Making  Medically screening exam initiated at 4:52 PM.  Appropriate orders placed.  Judi Saa was informed that the remainder of the evaluation will be completed by another provider, this initial triage assessment does not replace that evaluation, and the importance of remaining in the ED until their evaluation is complete.     Jeannie Fend, PA-C 06/02/23 1659

## 2023-06-02 NOTE — Discharge Instructions (Addendum)
He was seen today with COVID. For treatment I have prescribed Paxlovid which you should take as prescribed and Zofran for management of nausea. Tylenol and Advil per the label is a reasonable treatment for the myalgias. As we discussed, aggressively hydrate over the next 72 hours or return if you are unable to.  Additionally, you should start to see improvement by Saturday if you are not having substantial improvement at that time you may need to return for further care and management.  By the following Saturday, symptoms should be completely resolved if you are having interval worsening you would need to return.

## 2023-06-02 NOTE — ED Provider Notes (Signed)
[place] EMERGENCY DEPARTMENT AT [facility] Provider Note   CSN: [id number] Arrival date & time: 06/02/23  1637     History Chief Complaint  Patient presents with   Chest Pain   Shortness of Breath    HPI Leonard Cook is a 62 y.o. male presenting for fever cough congestion, myalgias, malaise, fatigue over the past 72 hours. Worsened last night acutely.  He is otherwise healthy does have a history of CAD as well as hyperlipidemia.  Started having diffuse chest pain worse with inspiration.  Ambulatory tolerating p.o. intake.  Slowly improving throughout the day today.  Tylenol prior to arrival was helpful.   Patient's recorded medical, surgical, social, medication list and allergies were reviewed in the Snapshot window as part of the initial history.   Review of Systems   Review of Systems  Constitutional:  Positive for fatigue and fever. Negative for chills.  HENT:  Negative for ear pain and sore throat.   Eyes:  Negative for pain and visual disturbance.  Respiratory:  Positive for cough and shortness of breath.   Cardiovascular:  Negative for chest pain and palpitations.  Gastrointestinal:  Negative for abdominal pain and vomiting.  Genitourinary:  Negative for dysuria and hematuria.  Musculoskeletal:  Positive for myalgias. Negative for arthralgias and back pain.  Skin:  Negative for color change and rash.  Neurological:  Negative for seizures and syncope.  All other systems reviewed and are negative.   Physical Exam Updated Vital Signs BP (!) 140/84 (BP Location: Right Arm)   Pulse 82   Temp 98.1 F (36.7 C) (Oral)   Resp 16   SpO2 98%  Physical Exam Vitals and nursing note reviewed.  Constitutional:      General: He is not in acute distress.    Appearance: He is well-developed.  HENT:     Head: Normocephalic and atraumatic.  Eyes:     Conjunctiva/sclera: Conjunctivae normal.  Cardiovascular:     Rate and Rhythm: Normal rate and regular rhythm.      Heart sounds: No murmur heard. Pulmonary:     Effort: Pulmonary effort is normal. No respiratory distress.     Breath sounds: Normal breath sounds.  Abdominal:     Palpations: Abdomen is soft.     Tenderness: There is no abdominal tenderness.  Musculoskeletal:        General: No swelling.     Cervical back: Neck supple.  Skin:    General: Skin is warm and dry.     Capillary Refill: Capillary refill takes less than 2 seconds.  Neurological:     Mental Status: He is alert.  Psychiatric:        Mood and Affect: Mood normal.      ED Course/ Medical Decision Making/ A&P    Procedures Procedures   Medications Ordered in ED Medications  ondansetron (ZOFRAN-ODT) disintegrating tablet 4 mg (has no administration in time range)  ketorolac (TORADOL) 15 MG/ML injection 15 mg (has no administration in time range)   Medical Decision Making:   Leonard Cook is a 62 y.o. male who presented to the ED today with subjective fever, cough, congestion detailed above.    Additional history discussed with patient's family/caregivers.  Patient placed on continuous vitals and telemetry monitoring while in ED which was reviewed periodically.   Complete initial physical exam performed, notably the patient  was hemodynamically stable in no acute distress.  Posterior oropharynx illuminated and without obvious swelling or deformity.  Patient is without neck stiffness.    Reviewed and confirmed nursing documentation for past medical history, family history, social history.    Initial Assessment:   With the patient's presentation of fever cough congestion, most likely diagnosis is developing viral upper respiratory infection. Other diagnoses were considered including (but not limited to) peritonsillar abscess, retropharyngeal abscess, pneumonia. These are considered less likely due to history of present illness and physical exam findings.   This is most consistent with an acute life/limb threatening  illness complicated by underlying chronic conditions. Considered meningitis, however patient's symptoms, vital signs, physical exam findings including lack of meningismus seem grossly less consistent at this time. Initial Plan:  Screening labs including CBC and Metabolic panel to evaluate for infectious or metabolic etiology of disease.  Viral screening including COVID/flu testing to evaluate for common viral etiologies that need to be tracked CXR to evaluate for structural/infectious intrathoracic pathology.  Empiric treatment with antipyretics including acetaminophen in ambulatory setting Will augment with dose of ketorolac in ED  Given his history of CAD and involvement of chest symptoms, EKG and serial troponins ordered to evaluate for obstructive cardiac disease Objective evaluation as below reviewed   Initial Study Results:   Laboratory  All laboratory results reviewed without evidence of clinically relevant pathology.    Radiology:  All images reviewed independently. Agree with radiology report at this time.   DG Chest 2 View  Final Result         Final Assessment and Plan:   On reassessment, patient is ambulatory tolerating p.o. intake in no acute distress.    Patient's COVID test was positive and patient is a candidate for treatment with Paxlovid due to duration of symptoms.  Will be started on Paxlovid and Zofran for symptom management..   Patient is currently stable for outpatient care and management with no indication for hospitalization or transfer at this time.  Discussed all findings with patient expressed understanding.  Disposition:  Based on the above findings, I believe patient is stable for discharge.    Patient/family educated about specific return precautions for given chief complaint and symptoms.  Patient/family educated about follow-up with PCP.     Patient/family expressed understanding of return precautions and need for follow-up. Patient spoken to  regarding all imaging and laboratory results and appropriate follow up for these results. All education provided in verbal form with additional information in written form. Time was allowed for answering of patient questions. Patient discharged.    Emergency Department Medication Summary:   Medications  ondansetron (ZOFRAN-ODT) disintegrating tablet 4 mg (has no administration in time range)  ketorolac (TORADOL) 15 MG/ML injection 15 mg (has no administration in time range)           Clinical Impression:  1. Chest pain, unspecified type   2. COVID      Discharge    Clinical Impression:  1. Chest pain, unspecified type   2. COVID      Discharge   Final Clinical Impression(s) / ED Diagnoses Final diagnoses:  Chest pain, unspecified type  COVID    Rx / DC Orders ED Discharge Orders          Ordered    ondansetron (ZOFRAN) 4 MG tablet  Every 6 hours        06/02/23 2216    nirmatrelvir & ritonavir (PAXLOVID, 300/100,) 20 x 150 MG & 10 x 100MG  TBPK  2 times daily        06/02/23 2216  Glyn Ade, MD 06/02/23 2229

## 2023-06-10 DIAGNOSIS — M25511 Pain in right shoulder: Secondary | ICD-10-CM | POA: Diagnosis not present

## 2023-06-15 DIAGNOSIS — M25511 Pain in right shoulder: Secondary | ICD-10-CM | POA: Diagnosis not present

## 2023-06-23 DIAGNOSIS — R051 Acute cough: Secondary | ICD-10-CM | POA: Diagnosis not present

## 2023-06-23 DIAGNOSIS — R0602 Shortness of breath: Secondary | ICD-10-CM | POA: Diagnosis not present

## 2023-06-24 DIAGNOSIS — M25511 Pain in right shoulder: Secondary | ICD-10-CM | POA: Diagnosis not present

## 2023-06-27 DIAGNOSIS — M25511 Pain in right shoulder: Secondary | ICD-10-CM | POA: Diagnosis not present

## 2023-07-01 DIAGNOSIS — M25511 Pain in right shoulder: Secondary | ICD-10-CM | POA: Diagnosis not present

## 2023-07-04 DIAGNOSIS — L821 Other seborrheic keratosis: Secondary | ICD-10-CM | POA: Diagnosis not present

## 2023-07-04 DIAGNOSIS — L905 Scar conditions and fibrosis of skin: Secondary | ICD-10-CM | POA: Diagnosis not present

## 2023-07-04 DIAGNOSIS — D225 Melanocytic nevi of trunk: Secondary | ICD-10-CM | POA: Diagnosis not present

## 2023-07-04 DIAGNOSIS — L218 Other seborrheic dermatitis: Secondary | ICD-10-CM | POA: Diagnosis not present

## 2023-07-08 DIAGNOSIS — M25511 Pain in right shoulder: Secondary | ICD-10-CM | POA: Diagnosis not present

## 2023-07-29 DIAGNOSIS — M25511 Pain in right shoulder: Secondary | ICD-10-CM | POA: Diagnosis not present

## 2023-08-19 DIAGNOSIS — Z0000 Encounter for general adult medical examination without abnormal findings: Secondary | ICD-10-CM | POA: Diagnosis not present

## 2023-08-19 DIAGNOSIS — Z125 Encounter for screening for malignant neoplasm of prostate: Secondary | ICD-10-CM | POA: Diagnosis not present

## 2023-08-26 DIAGNOSIS — K219 Gastro-esophageal reflux disease without esophagitis: Secondary | ICD-10-CM | POA: Diagnosis not present

## 2023-08-26 DIAGNOSIS — Z0000 Encounter for general adult medical examination without abnormal findings: Secondary | ICD-10-CM | POA: Diagnosis not present

## 2023-08-26 DIAGNOSIS — R053 Chronic cough: Secondary | ICD-10-CM | POA: Diagnosis not present

## 2023-08-26 DIAGNOSIS — Z23 Encounter for immunization: Secondary | ICD-10-CM | POA: Diagnosis not present

## 2023-08-26 DIAGNOSIS — Z1212 Encounter for screening for malignant neoplasm of rectum: Secondary | ICD-10-CM | POA: Diagnosis not present

## 2023-08-26 DIAGNOSIS — I2510 Atherosclerotic heart disease of native coronary artery without angina pectoris: Secondary | ICD-10-CM | POA: Diagnosis not present

## 2023-08-26 DIAGNOSIS — R9431 Abnormal electrocardiogram [ECG] [EKG]: Secondary | ICD-10-CM | POA: Diagnosis not present

## 2023-09-29 ENCOUNTER — Encounter (HOSPITAL_BASED_OUTPATIENT_CLINIC_OR_DEPARTMENT_OTHER): Payer: Self-pay

## 2024-01-06 DIAGNOSIS — H2513 Age-related nuclear cataract, bilateral: Secondary | ICD-10-CM | POA: Diagnosis not present

## 2024-01-06 DIAGNOSIS — H5203 Hypermetropia, bilateral: Secondary | ICD-10-CM | POA: Diagnosis not present

## 2024-01-06 DIAGNOSIS — H52223 Regular astigmatism, bilateral: Secondary | ICD-10-CM | POA: Diagnosis not present

## 2024-01-06 DIAGNOSIS — H524 Presbyopia: Secondary | ICD-10-CM | POA: Diagnosis not present

## 2024-01-06 DIAGNOSIS — H1045 Other chronic allergic conjunctivitis: Secondary | ICD-10-CM | POA: Diagnosis not present

## 2024-01-06 DIAGNOSIS — H04123 Dry eye syndrome of bilateral lacrimal glands: Secondary | ICD-10-CM | POA: Diagnosis not present

## 2024-01-06 DIAGNOSIS — H47321 Drusen of optic disc, right eye: Secondary | ICD-10-CM | POA: Diagnosis not present

## 2024-01-12 DIAGNOSIS — D3611 Benign neoplasm of peripheral nerves and autonomic nervous system of face, head, and neck: Secondary | ICD-10-CM | POA: Diagnosis not present

## 2024-01-12 DIAGNOSIS — D485 Neoplasm of uncertain behavior of skin: Secondary | ICD-10-CM | POA: Diagnosis not present

## 2024-01-30 DIAGNOSIS — H938X2 Other specified disorders of left ear: Secondary | ICD-10-CM | POA: Diagnosis not present

## 2024-01-30 DIAGNOSIS — J3489 Other specified disorders of nose and nasal sinuses: Secondary | ICD-10-CM | POA: Diagnosis not present

## 2024-01-30 DIAGNOSIS — J029 Acute pharyngitis, unspecified: Secondary | ICD-10-CM | POA: Diagnosis not present

## 2024-05-01 ENCOUNTER — Ambulatory Visit: Payer: Self-pay

## 2024-05-01 NOTE — Telephone Encounter (Signed)
 Chief Complaint: SOB Symptoms: SOB, cough Frequency: June 2024 Pertinent Negatives: Patient denies fever, URI sx, CP, severe SOB Disposition: [] ED /[] Urgent Care (no appt availability in office) / [x] Appointment(In office/virtual)/ []  [facility]/ [] Home Care/ [] Refused Recommended Disposition /[] [facility]/ []  Follow-up with PCP Additional Notes: Pt calling initially to schedule new pt Pulm appt, but PAS transferred to NT for exhibiting sx of SOB. Upon further investigation, pt has had chronic SOB x 1 year with unknown etiology. Pt reports it is not with exertion and occurs randomly. Pt has been following with PCP, but is now looking to seek specialist. Pt denies any fever, URI sx, CP, severe SOB. Pt is not in any active distress and is speaking in full sentences. No audible wheezing/SOB noted. Scheduled patient per protocol on 07/19/2024. Patient verbalized understanding and to call back/call PCP with worsening/acute symptoms.     Copied from CRM [id number]. Topic: Clinical - Red Word Triage >> May 01, 2024  4:59 PM Tyronne Galloway wrote: Red Word that prompted transfer to Nurse Triage: New patient not scheduled, stated he has had shortness of breath and coughing fits off and on since June of 2024. He noticed after having Covid in 2022. Answer Assessment - Initial Assessment Questions E2C2 Pulmonary Triage - Initial Assessment Questions "Chief Complaint (e.g., cough, sob, wheezing, fever, chills, sweat or additional symptoms) *Go to specific symptom protocol after initial questions. SOB, coughing  "How long have symptoms been present?" June 2024  Have you tested for COVID or Flu? Note: If not, ask patient if a home test can be taken. If so, instruct patient to call back for positive results. No  MEDICINES:   "Have you used any OTC meds to help with symptoms?" No If yes, ask "What medications?" N/a  "Have you used your inhalers/maintenance medication?" No If yes, "What  medications?" N/a  If inhaler, ask "How many puffs and how often?" Note: Review instructions on medication in the chart. N/a  OXYGEN: "Do you wear supplemental oxygen?" No If yes, "How many liters are you supposed to use?" N/a  "Do you monitor your oxygen levels?" No If yes, "What is your reading (oxygen level) today?" N/a  "What is your usual oxygen saturation reading?"  (Note: Pulmonary O2 sats should be 90% or greater) N/a   1. RESPIRATORY STATUS: "Describe your breathing?" (e.g., wheezing, shortness of breath, unable to speak, severe coughing)      SOB is NOT exertional, pt reports SOB is random  3. PATTERN "Does the difficult breathing come and go, or has it been constant since it started?"      intermittent 4. SEVERITY: "How bad is your breathing?" (e.g., mild, moderate, severe)    - MILD: No SOB at rest, mild SOB with walking, speaks normally in sentences, can lie down, no retractions, pulse < 100.    - MODERATE: SOB at rest, SOB with minimal exertion and prefers to sit, cannot lie down flat, speaks in phrases, mild retractions, audible wheezing, pulse 100-120.    - SEVERE: Very SOB at rest, speaks in single words, struggling to breathe, sitting hunched forward, retractions, pulse > 120      Mild SOB - reports lasts about 20 minutes, with dry cough associated with it Reports everything starts "running" "eyes, nose, etc" 5. RECURRENT SYMPTOM: "Have you had difficulty breathing before?" If Yes, ask: "When was the last time?" and "What happened that time?"      unknown 6. CARDIAC HISTORY: "Do you have any  history of heart disease?" (e.g., heart attack, angina, bypass surgery, angioplasty)      Denies Endorses high cholesterol 7. LUNG HISTORY: "Do you have any history of lung disease?"  (e.g., pulmonary embolus, asthma, emphysema)     denies 8. CAUSE: "What do you think is causing the breathing problem?"      Possible COVID infections x 2 9. OTHER SYMPTOMS: "Do you have any  other symptoms? (e.g., dizziness, runny nose, cough, chest pain, fever)     Afebrile Endorses hx of shoulder surgery pain  Protocols used: Breathing Difficulty-A-AH

## 2024-05-05 NOTE — Progress Notes (Unsigned)
 Cardiology Office Note   Date:  05/09/2024   ID:  Leonard Cook, DOB 1961/12/20, MRN [id number]  PCP:  Imelda Man, MD  Cardiologist:   Beverlie Kurihara [place], MD   Chief Complaint  Patient presents with   Coronary Artery Disease   Chest Pain      History of Present Illness: Leonard Cook is a 63 y.o. male who is seen for follow up CAD.  He has a history of HLD and obesity. He had an ETT in November 2017 that showed excellent exercise tolerance with hypertensive response. There was ST elevation noted in lead V1 but since this was a single lead this was felt to be OK.  His father had CAD in his early 63s but lived to be 51.   We performed coronary CTA in Nov 2021 which suggested significant stenosis in the mid to distal LCx. Small caliber vessel. FFR was overall normal but apparently the distal LCx was not modeled. When seen in follow up his symptoms were fairly atypical and he was managed medically.   He really did great with lifestyle modification. Lost 8 lbs. Was exercising regularly and eating well. LDL came down to 53. He notes there have been some life stressors. Was not compliant with medication and LDL went up to 163 last August.   Last June he had Covid. States he was really sick. Since then has some SOB and coughing spells. Has noted pain in left upper chest. Goes into the left shoulder and inner biceps. Usually at rest. Able to bike, kayak for 2 hours and walk without any pain. Did have shoulder reconstruction on the right.    Past Medical History:  Diagnosis Date   Allergic rhinitis due to pollen    Depressive disorder    GERD (gastroesophageal reflux disease)    Hyperlipidemia    IBS (irritable bowel syndrome)    Insomnia    Migraine    Obesity    Onychomycosis    Reflux    Thyroiditis    Trochanteric bursitis of right hip     Past Surgical History:  Procedure Laterality Date   CHOLECYSTECTOMY     age 68   ESOPHAGOGASTRODUODENOSCOPY  27   KNEE ARTHROSCOPY  WITH ANTERIOR CRUCIATE LIGAMENT (ACL) REPAIR  01/2016     Current Outpatient Medications  Medication Sig Dispense Refill   aspirin 81 MG tablet Take 81 mg by mouth daily.     buPROPion (WELLBUTRIN SR) 100 MG 12 hr tablet Take 100 mg by mouth daily.   0   Evolocumab  (REPATHA  SURECLICK) 140 MG/ML SOAJ Inject 140 mg into the skin every 14 (fourteen) days. 2 mL 11   ezetimibe (ZETIA) 10 MG tablet Take 10 mg by mouth daily.     FLUoxetine (PROZAC) 10 MG capsule Take 10 mg by mouth every morning.     Multiple Vitamin (MULTIVITAMIN) tablet Take 1 tablet by mouth daily.     Omega-3 Fatty Acids (FISH OIL OMEGA-3 PO) Take 1 tablet by mouth daily.     ondansetron  (ZOFRAN ) 4 MG tablet Take 1 tablet (4 mg total) by mouth every 6 (six) hours. 30 tablet 0   rosuvastatin  (CRESTOR ) 40 MG tablet Take 1 tablet (40 mg total) by mouth daily. 90 tablet 3   TURMERIC PO Take 1 tablet by mouth daily.     VITAMIN D, CHOLECALCIFEROL, PO Take 1 capsule by mouth daily.     No current facility-administered medications for this visit.  Allergies:   Amoxicillin, Atorvastatin, Augmentin [amoxicillin-pot clavulanate], Paxil [paroxetine hcl], Serzone [nefazodone], Vortioxetine, and Zoloft [sertraline hcl]    Social History:  The patient  reports that he has never smoked. He has never used smokeless tobacco. He reports current alcohol use. He reports that he does not use drugs.   Family History:  The patient's family history includes Gallbladder disease in his mother; Heart attack in his brother and paternal grandmother; Heart disease (age of onset: 28) in his father; Heart failure in his father; Hyperlipidemia in his brother, brother, and father; Hypertension in his brother; Liver cancer in his mother; Prostate cancer in his father; Stroke in his paternal grandfather.    ROS:  Please see the history of present illness.   Otherwise, review of systems are positive for none.   All other systems are reviewed and negative.     PHYSICAL EXAM: VS:  BP 118/70 (BP Location: Left Arm, Cuff Size: Normal)   Pulse 67   Ht 5\' 7"  (1.702 m)   Wt 187 lb (84.8 kg)   SpO2 96%   BMI 29.29 kg/m  , BMI Body mass index is 29.29 kg/m. GEN: Well nourished, well developed, in no acute distress  HEENT: normal  Neck: no JVD, carotid bruits, or masses Cardiac: RRR; no murmurs, rubs, or gallops,no edema  Respiratory:  clear to auscultation bilaterally, normal work of breathing GI: soft, nontender, nondistended, + BS MS: no deformity or atrophy  Skin: warm and dry, no rash Neuro:  Strength and sensation are intact Psych: euthymic mood, full affect   EKG Interpretation Date/Time:  Wednesday May 09 2024 09:24:59 EDT Ventricular Rate:  62 PR Interval:  182 QRS Duration:  80 QT Interval:  396 QTC Calculation: 401 R Axis:   19  Text Interpretation: Normal sinus rhythm Normal ECG When compared with ECG of 02-Jun-2023 16:22, Vent. rate has decreased BY  30 BPM Nonspecific T wave abnormality no longer evident in Inferior leads Nonspecific T wave abnormality, improved in Lateral leads Confirmed by [place], Shakeena Kafer [phone]) on 05/09/2024 9:36:23 AM     Recent Labs: 06/02/2023: BUN 12; Creatinine, Ser 1.15; Hemoglobin 14.1; Platelets 351; Potassium 4.2; Sodium 132    Lipid Panel No results found for: "CHOL", "TRIG", "HDL", "CHOLHDL", "VLDL", "LDLCALC", "LDLDIRECT"   Lab dated 08/06/20: creatinine 1.21. ALT 26.  Labs dated 09/30/20: cholesterol 215, triglycerides 132, HDL 46, LDL 145. TSH Normal. BUN 11, creatinine 1.3. other chemistries normal. UA normal.  Dated 04/02/21: cholesterol 229, triglycerides 175, HDL 45, LDL 152.  Dated 06/28/22: cholesterol 116, triglycerides 91, HDL 45, LDL 53. UA, CMET, CBC normal.  Dated 08/19/23: cholesterol 237, triglycerides 161, HDL 45, LDL 163.  Wt Readings from Last 3 Encounters:  05/09/24 187 lb (84.8 kg)  10/01/22 189 lb (85.7 kg)  03/23/22 190 lb 3.2 oz (86.3 kg)     Other studies  Reviewed: Additional studies/ records that were reviewed today include:   ADDENDUM REPORT: 11/14/2020 11:48   HISTORY: 63 yo male with chest pain, cardiac cause suspected   EXAM: Cardiac/Coronary CTA   TECHNIQUE: The patient was scanned on a [employer].   PROTOCOL: A 120 kV prospective scan was triggered in the descending thoracic aorta at 111 HU's. Axial non-contrast 3 mm slices were carried out through the heart. The data set was analyzed on a dedicated work station and scored using the Agatson method. Gantry rotation speed was 250 msecs and collimation was .6 mm. Beta blockade and 0.8 mg of sl  NTG was given. The 3D data set was reconstructed in 5% intervals of the 67-82 % of the R-R cycle. Diastolic phases were analyzed on a dedicated work station using MPR, MIP and VRT modes. The patient received 80mL OMNIPAQUE  IOHEXOL  350 MG/ML SOLN of contrast.   FINDINGS: Quality: Excellent, HR 52   Coronary calcium  score: The patient's coronary artery calcium  score is 47, which places the patient in the 56th percentile. Calcium  primarily in the proximal LAD and proximal LCx.   Coronary arteries: Normal coronary origins.  Right dominance.   Right Coronary Artery: Dominant.  Normal vessel without disease.   Left Main Coronary Artery: Normal. Bifurcates into the LAD and LCx arteries.   Left Anterior Descending Coronary Artery: Minimal 1-24% mixed proximal stenosis. Small to moderate size D1 and D2 branches without stenosis.   Left Circumflex Artery: Smaller caliber AV vessel with mild 24-49% ostial stenosis (CADRADS2). There is a moderate to severe 50-69% (CADRADS3) non-calcified mid-vessel stenosis. Large high OM1 branch without disease.   Aorta: Normal size, 31 mm at the mid ascending aorta (level of the PA bifurcation) measured double oblique. No calcifications. No dissection.   Aortic Valve: Trileaflet.  No calcifications.   Other findings:   Normal  pulmonary vein drainage into the left atrium.   Normal left atrial appendage without a thrombus.   Normal size of the pulmonary artery.   Sequelae of prior cholecystectomy (clips noted)   IMPRESSION: 1. Moderate to severe discrete non-calcified mid-LCx stenosis, CADRADS = 3. Otherwise minimal mixed proximal LAD and LCx stenoses. Will send for FFR.   2. Coronary calcium  score of 47. This was 56th percentile for age and sex matched control.   3. Normal coronary origin with right dominance.   4. Aggressive risk factor modifcation recommended.     Electronically Signed   By: Hazle Lites M.D.   On: 11/14/2020 11:48    CT FFR ANALYSIS   CLINICAL DATA:  Chest pain, cardiac cause suspected   FINDINGS: FFRct analysis was performed on the original cardiac CT angiogram dataset. Diagrammatic representation of the FFRct analysis is provided in a separate PDF document in PACS. This dictation was created using the PDF document and an interactive 3D model of the results. 3D model is not available in the EMR/PACS. Normal FFR range is >0.80.   1. Left Main:  No significant stenosis. FFR = 0.99   2. LAD: No significant stenosis. Proximal FFR = 0.98, Mid FFR = 0.94, Distal FFR = 0.86 3. LCX: No significant stenosis. Proximal FFR = 0.98, Distal FFR = Not modeled 4. RCA: No significant stenosis. Proximal FFR = 0.99, Mid FFR = 0.94, Distal FFR = 0.92   IMPRESSION: 1.  CT FFR analysis didn't show any significant stenosis.   2. The mid to distal LCx was not modeled, therefore, stenosis cannot be excluded.   3. Clinical correlation advised to determine if further evaluation is warranted.     Electronically Signed   By: Hazle Lites M.D.   On: 11/15/2020 12:39     ASSESSMENT AND PLAN:  1.  CAD.  Risk factors of HLD and family history. Prior ETT in 2017 was OK but did show some ST changes. Coronary CTA showed moderate stenosis in the mid- distal LCx- small caliber vessel.  No other significant disease so overall low risk.   Now with atypical chest pain. Concerned about his cardiac situation. I have recommended repeat CTA and we can compare with prior study.   2. HLD.  Given CAD noted on CTA recommend goal LDL < 55. Now compliant with Crestor  40 mg daily and Zetia. Will update labs today.  Reinforced importance of lifestyle changes.   3. History of Covid June 2024. Some persistent respiratory symptoms. To see pulmonary     Disposition:   FU TBD based on CTA  Signed, Kerisha Goughnour [place], MD  05/09/2024 9:45 AM    [facility] [address], [place], [place], [address] Phone [phone], Fax [phone]

## 2024-05-09 ENCOUNTER — Ambulatory Visit: Payer: BC Managed Care – PPO | Attending: Cardiology | Admitting: Cardiology

## 2024-05-09 ENCOUNTER — Encounter: Payer: Self-pay | Admitting: Cardiology

## 2024-05-09 VITALS — BP 118/70 | HR 67 | Ht 67.0 in | Wt 187.0 lb

## 2024-05-09 DIAGNOSIS — R072 Precordial pain: Secondary | ICD-10-CM

## 2024-05-09 DIAGNOSIS — I25118 Atherosclerotic heart disease of native coronary artery with other forms of angina pectoris: Secondary | ICD-10-CM | POA: Diagnosis not present

## 2024-05-09 DIAGNOSIS — E7800 Pure hypercholesterolemia, unspecified: Secondary | ICD-10-CM

## 2024-05-09 LAB — COMPREHENSIVE METABOLIC PANEL WITH GFR
ALT: 29 IU/L (ref 0–44)
AST: 30 IU/L (ref 0–40)
Albumin: 4.5 g/dL (ref 3.9–4.9)
Alkaline Phosphatase: 69 IU/L (ref 44–121)
BUN/Creatinine Ratio: 10 (ref 10–24)
BUN: 13 mg/dL (ref 8–27)
Bilirubin Total: 0.3 mg/dL (ref 0.0–1.2)
CO2: 23 mmol/L (ref 20–29)
Calcium: 9.9 mg/dL (ref 8.6–10.2)
Chloride: 100 mmol/L (ref 96–106)
Creatinine, Ser: 1.29 mg/dL — ABNORMAL HIGH (ref 0.76–1.27)
Globulin, Total: 2.4 g/dL (ref 1.5–4.5)
Glucose: 90 mg/dL (ref 70–99)
Potassium: 5 mmol/L (ref 3.5–5.2)
Sodium: 137 mmol/L (ref 134–144)
Total Protein: 6.9 g/dL (ref 6.0–8.5)
eGFR: 62 mL/min/{1.73_m2} (ref >59)

## 2024-05-09 LAB — LIPID PANEL
Chol/HDL Ratio: 2.8 ratio (ref 0.0–5.0)
Cholesterol, Total: 137 mg/dL (ref 100–199)
HDL: 49 mg/dL (ref >39)
LDL Chol Calc (NIH): 65 mg/dL (ref 0–99)
Triglycerides: 132 mg/dL (ref 0–149)
VLDL Cholesterol Cal: 23 mg/dL (ref 5–40)

## 2024-05-09 MED ORDER — METOPROLOL TARTRATE 100 MG PO TABS: ORAL_TABLET | ORAL | 0 refills | Status: AC

## 2024-05-09 NOTE — Patient Instructions (Addendum)
 Medication Instructions:  Continue same medications *If you need a refill on your cardiac medications before your next appointment, please call your pharmacy*  Lab Work: Cmet,lipid panel today  Testing/Procedures: Coronary CT  will be scheduled after approved by insurance  Follow instructions below   Follow-Up: At [facility], you and your health needs are our priority.  As part of our continuing mission to provide you with exceptional heart care, our providers are all part of one team.  This team includes your primary Cardiologist (physician) and Advanced Practice Providers or APPs (Physician Assistants and Nurse Practitioners) who all work together to provide you with the care you need, when you need it.  Your next appointment:  1 year   Call in Feb to schedule May appointment     Provider:  Dr.Jordan   We recommend signing up for the patient portal called "MyChart".  Sign up information is provided on this After Visit Summary.  MyChart is used to connect with patients for Virtual Visits (Telemedicine).  Patients are able to view lab/test results, encounter notes, upcoming appointments, etc.  Non-urgent messages can be sent to your provider as well.   To learn more about what you can do with MyChart, go to [email or web address].       Your cardiac CT will be scheduled at one of the below locations:   [facility] [address] [place], [place] [address] [phone]  OR  [facility] [address] [place], [place] [address] [phone]  OR   [facility] [address] [place], [place] [address] [phone]  OR   [facility] [address] [place], [place] [address] [phone]  OR   [facility] [address]  [place], [place] [address] Opening April 23, 2024  If scheduled at [facility], please arrive at the  [facility] and Children's Entrance (Entrance C2) of [facility] 30 minutes prior to test start time. You can use the FREE valet parking offered at entrance C (encouraged to control the heart rate for the test)  Proceed to the [facility] Radiology Department (first floor) to check-in and test prep.   All radiology patients and guests should use entrance C2 at [facility], accessed from [facility], even though the hospital's physical address listed is [address].    If scheduled at the Heart and Vascular Tower at [address], please enter the parking lot using the [address] entrance and use the FREE valet service at the patient drop-off area. Enter the buidling and check-in with registration on the main floor.  If scheduled at [facility] or [facility], please arrive 15 mins early for check-in and test prep.  There is spacious parking and easy access to the radiology department from the [facility] Heart and Vascular entrance. Please enter here and check-in with the desk attendant.   If scheduled at [facility], please arrive 30 minutes early for check-in and test prep.  Please follow these instructions carefully (unless otherwise directed):  An IV will be required for this test and Nitroglycerin  will be given.  Hold all erectile dysfunction medications at least 3 days (72 hrs) prior to test. (Ie viagra, cialis, sildenafil, tadalafil, etc)   On the Night Before the Test: Be sure to Drink plenty of water. Do not consume any caffeinated/decaffeinated beverages or chocolate 12 hours  prior to your test. Do not take any antihistamines 12 hours prior to your test.   On the Day of the Test: Drink plenty of water until 1 hour prior to the test. Do not eat any food 1 hour prior to test. You may take your regular medications prior to the test.  Take metoprolol  100 mg two hours prior to test. If you  take Furosemide/Hydrochlorothiazide/Spironolactone/Chlorthalidone, please HOLD on the morning of the test.        After the Test: Drink plenty of water. After receiving IV contrast, you may experience a mild flushed feeling. This is normal. On occasion, you may experience a mild rash up to 24 hours after the test. This is not dangerous. If this occurs, you can take Benadryl 25 mg, Zyrtec, Claritin, or Allegra and increase your fluid intake. (Patients taking Tikosyn should avoid Benadryl, and may take Zyrtec, Claritin, or Allegra) If you experience trouble breathing, this can be serious. If it is severe call 911 IMMEDIATELY. If it is mild, please call our office.  We will call to schedule your test 2-4 weeks out understanding that some insurance companies will need an authorization prior to the service being performed.   For more information and frequently asked questions, please visit our website : [email or web address]  For non-scheduling related questions, please contact the cardiac imaging nurse navigator should you have any questions/concerns: Cardiac Imaging Nurse Navigators Direct Office Dial: [phone]   For scheduling needs, including cancellations and rescheduling, please call [place], [phone].

## 2024-05-10 ENCOUNTER — Ambulatory Visit: Payer: Self-pay | Admitting: Cardiology

## 2024-05-25 ENCOUNTER — Telehealth (HOSPITAL_COMMUNITY): Payer: Self-pay | Admitting: Emergency Medicine

## 2024-05-25 ENCOUNTER — Encounter (HOSPITAL_COMMUNITY): Payer: Self-pay

## 2024-05-25 NOTE — Telephone Encounter (Signed)
 Attempted to call patient regarding upcoming cardiac CT appointment. Left message on voicemail with name and callback number Rockwell Alexandria RN Navigator Cardiac Imaging [facility] V[facility] Services [phone] Office [phone] Cell

## 2024-05-28 ENCOUNTER — Ambulatory Visit (HOSPITAL_COMMUNITY)
Admission: RE | Admit: 2024-05-28 | Discharge: 2024-05-28 | Disposition: A | Discharge status: Home / Self Care | Source: Ambulatory Visit | Attending: Cardiology | Admitting: Cardiology

## 2024-05-28 DIAGNOSIS — I25118 Atherosclerotic heart disease of native coronary artery with other forms of angina pectoris: Secondary | ICD-10-CM | POA: Diagnosis not present

## 2024-05-28 DIAGNOSIS — R072 Precordial pain: Secondary | ICD-10-CM | POA: Diagnosis not present

## 2024-05-28 MED ORDER — NITROGLYCERIN 0.4 MG SL SUBL
SUBLINGUAL_TABLET | SUBLINGUAL | Status: AC
  Filled 2024-05-28: qty 2

## 2024-05-28 MED ORDER — IOHEXOL 350 MG/ML SOLN
100.0000 mL | Freq: Once | INTRAVENOUS | Status: AC | PRN
  Administered 2024-05-28: 100 mL via INTRAVENOUS

## 2024-05-29 ENCOUNTER — Other Ambulatory Visit: Payer: Self-pay

## 2024-06-13 ENCOUNTER — Ambulatory Visit (AMBULATORY_SURGERY_CENTER)

## 2024-06-13 ENCOUNTER — Telehealth: Payer: Self-pay | Admitting: *Deleted

## 2024-06-13 ENCOUNTER — Encounter: Payer: Self-pay | Admitting: Internal Medicine

## 2024-06-13 VITALS — Ht 67.0 in | Wt 182.0 lb

## 2024-06-13 DIAGNOSIS — Z1211 Encounter for screening for malignant neoplasm of colon: Secondary | ICD-10-CM

## 2024-06-13 MED ORDER — NA SULFATE-K SULFATE-MG SULF 17.5-3.13-1.6 GM/177ML PO SOLN
1.0000 | Freq: Once | ORAL | 0 refills | Status: AC
Start: 2024-06-13 — End: 2024-06-13

## 2024-06-13 NOTE — Telephone Encounter (Signed)
 Attempted x 2 to reach patient for telephone PV. Requested return phone call by end of day today to reschedule and avoid cancellation of colonoscopy.

## 2024-06-13 NOTE — Telephone Encounter (Signed)
 Patient returned call and PV completed.

## 2024-06-13 NOTE — Progress Notes (Signed)

## 2024-06-14 ENCOUNTER — Encounter

## 2024-06-25 NOTE — Progress Notes (Unsigned)
 Leonard Cook History and Physical   Primary Care Physician:  Clarice Nottingham, MD   Reason for Procedure:  Colon cancer screening  Plan:    Colonoscopy     HPI: Leonard Cook is a 63 y.o. male here for a screening colonoscopy.   Past Medical History:  Diagnosis Date   Allergic rhinitis due to pollen    Depressive disorder    GERD (gastroesophageal reflux disease)    Hyperlipidemia    IBS (irritable bowel syndrome)    Insomnia    Migraine    Obesity    Onychomycosis    Reflux    Thyroiditis    Trochanteric bursitis of right hip     Past Surgical History:  Procedure Laterality Date   CHOLECYSTECTOMY     age 63   ESOPHAGOGASTRODUODENOSCOPY  59   KNEE ARTHROSCOPY WITH ANTERIOR CRUCIATE LIGAMENT (ACL) REPAIR  01/2016    Prior to Admission medications   Medication Sig Start Date End Date Taking? Authorizing Provider  aspirin 81 MG tablet Take 81 mg by mouth daily.    [provider]  buPROPion (WELLBUTRIN SR) 100 MG 12 hr tablet Take 100 mg by mouth daily.  09/05/17   [provider]  Evolocumab  (REPATHA  SURECLICK) 140 MG/ML SOAJ Inject 140 mg into the skin every 14 (fourteen) days. 03/23/22   [place], Peter M, MD  ezetimibe (ZETIA) 10 MG tablet Take 10 mg by mouth daily. 09/22/21   [provider]  FLUoxetine (PROZAC) 10 MG capsule Take 10 mg by mouth every morning. 11/20/21   [provider]  metoprolol  tartrate (LOPRESSOR ) 100 MG tablet Take 100 mg 2 hours before Coronary CT Patient not taking: Reported on 06/13/2024 05/09/24   [place], Peter M, MD  Multiple Vitamin (MULTIVITAMIN) tablet Take 1 tablet by mouth daily.    [provider]  Omega-3 Fatty Acids (FISH OIL OMEGA-3 PO) Take 1 tablet by mouth daily.    [provider]  ondansetron  (ZOFRAN ) 4 MG tablet Take 1 tablet (4 mg total) by mouth every 6 (six) hours. Patient not taking: Reported on 06/13/2024 06/02/23   Jerral Meth, MD  rosuvastatin   (CRESTOR ) 40 MG tablet Take 1 tablet (40 mg total) by mouth daily. 11/18/20 06/13/24  [place], Peter M, MD  TURMERIC PO Take 1 tablet by mouth daily.    [provider]  VITAMIN D, CHOLECALCIFEROL, PO Take 1 capsule by mouth daily.    [provider]    Current Outpatient Medications  Medication Sig Dispense Refill   aspirin 81 MG tablet Take 81 mg by mouth daily.     buPROPion (WELLBUTRIN SR) 100 MG 12 hr tablet Take 100 mg by mouth daily.   0   Evolocumab  (REPATHA  SURECLICK) 140 MG/ML SOAJ Inject 140 mg into the skin every 14 (fourteen) days. 2 mL 11   ezetimibe (ZETIA) 10 MG tablet Take 10 mg by mouth daily.     FLUoxetine (PROZAC) 10 MG capsule Take 10 mg by mouth every morning.     metoprolol  tartrate (LOPRESSOR ) 100 MG tablet Take 100 mg 2 hours before Coronary CT (Patient not taking: Reported on 06/13/2024) 1 tablet 0   Multiple Vitamin (MULTIVITAMIN) tablet Take 1 tablet by mouth daily.     Omega-3 Fatty Acids (FISH OIL OMEGA-3 PO) Take 1 tablet by mouth daily.     ondansetron  (ZOFRAN ) 4 MG tablet Take 1 tablet (4 mg total) by mouth every 6 (six) hours. (Patient not taking: Reported on 06/13/2024) 30  tablet 0   rosuvastatin  (CRESTOR ) 40 MG tablet Take 1 tablet (40 mg total) by mouth daily. 90 tablet 3   TURMERIC PO Take 1 tablet by mouth daily.     VITAMIN D, CHOLECALCIFEROL, PO Take 1 capsule by mouth daily.     No current facility-administered medications for this visit.    Allergies as of 06/26/2024 - Review Complete 06/13/2024  Allergen Reaction Noted   Amoxicillin Other (See Comments) 10/04/2014   Atorvastatin Other (See Comments) 10/04/2014   Augmentin [amoxicillin-pot clavulanate] Other (See Comments) 10/04/2014   Paxil [paroxetine hcl] Diarrhea 10/04/2014   Serzone [nefazodone]  10/04/2014   Vortioxetine Other (See Comments) 04/03/2018   Zoloft [sertraline hcl] Diarrhea 10/04/2014    Family History  Problem Relation Age of Onset   Colon polyps Mother     Liver cancer Mother    Gallbladder disease Mother    Colon polyps Father    Prostate cancer Father    Hyperlipidemia Father    Heart disease Father 61       PCI x2   Heart failure Father    Hyperlipidemia Brother    Hypertension Brother    Heart attack Brother    Hyperlipidemia Brother    Esophageal cancer Maternal Grandfather    Heart attack Paternal Grandmother    Stroke Paternal Grandfather    Colon cancer Neg Hx    Rectal cancer Neg Hx    Stomach cancer Neg Hx     Social History   Socioeconomic History   Marital status: Married    Spouse name: Not on file   Number of children: 2   Years of education: Not on file   Highest education level: Not on file  Occupational History   Occupation: Engineer, agricultural  Tobacco Use   Smoking status: Never   Smokeless tobacco: Never  Vaping Use   Vaping status: Never Used  Substance and Sexual Activity   Alcohol use: Yes    Comment: 2 beers a wk   Drug use: No   Sexual activity: Not on file  Other Topics Concern   Not on file  Social History Narrative   Married with 1 son and 1 daughter   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  Physical Exam: Vital signs There were no vitals taken for this visit.  General:   Alert,  Well-developed, well-nourished, pleasant and cooperative in NAD Lungs:  Clear throughout to auscultation.   Heart:  Regular rate and rhythm; no murmurs, clicks, rubs,  or gallops. Abdomen:   Soft, nontender and nondistended. Normal bowel sounds.   Neuro/Psych:  Alert and cooperative. Normal mood and affect. A and O x 3   @Akeela Busk  CHARLENA Commander, MD, [facility] [phone] (pager) 06/25/2024 7:51 PM@

## 2024-06-26 ENCOUNTER — Ambulatory Visit (AMBULATORY_SURGERY_CENTER): Admitting: Internal Medicine

## 2024-06-26 ENCOUNTER — Encounter: Payer: Self-pay | Admitting: Internal Medicine

## 2024-06-26 VITALS — BP 121/73 | HR 58 | Temp 97.5°F | Resp 13 | Ht 67.0 in | Wt 181.0 lb

## 2024-06-26 DIAGNOSIS — Z1211 Encounter for screening for malignant neoplasm of colon: Secondary | ICD-10-CM

## 2024-06-26 DIAGNOSIS — K5730 Diverticulosis of large intestine without perforation or abscess without bleeding: Secondary | ICD-10-CM | POA: Diagnosis not present

## 2024-06-26 MED ORDER — SODIUM CHLORIDE 0.9 % IV SOLN
500.0000 mL | Freq: Once | INTRAVENOUS | Status: DC
Start: 2024-06-26 — End: 2024-06-26

## 2024-06-26 NOTE — Progress Notes (Signed)
 Sedate, gd SR, tolerated procedure well, VSS, report to RN

## 2024-06-26 NOTE — Progress Notes (Signed)
 Pt's states no medical or surgical changes since previsit or office visit.

## 2024-06-26 NOTE — Op Note (Signed)
 [facility] Patient Name: Leonard Cook Procedure Date: 06/26/2024 9:40 AM MRN: [id number] Endoscopist: Lupita FORBES Commander , MD, [id number] Age: 63 Referring MD:  Date of Birth: October 30, 1961 Gender: Male Account #: [id number] Procedure:                Colonoscopy Indications:              Screening for colorectal malignant neoplasm, This                            is the patient's first colonoscopy Medicines:                Monitored Anesthesia Care Procedure:                Pre-Anesthesia Assessment:                           - Prior to the procedure, a History and Physical                            was performed, and patient medications and                            allergies were reviewed. The patient's tolerance of                            previous anesthesia was also reviewed. The risks                            and benefits of the procedure and the sedation                            options and risks were discussed with the patient.                            All questions were answered, and informed consent                            was obtained. Prior Anticoagulants: The patient has                            taken no anticoagulant or antiplatelet agents. ASA                            Grade Assessment: II - A patient with mild systemic                            disease. After reviewing the risks and benefits,                            the patient was deemed in satisfactory condition to                            undergo the procedure.  After obtaining informed consent, the colonoscope                            was passed under direct vision. Throughout the                            procedure, the patient's blood pressure, pulse, and                            oxygen saturations were monitored continuously. The                            CF [id number] #[id number] was introduced through the anus                            and advanced to the the  cecum, identified by                            appendiceal orifice and ileocecal valve. The                            colonoscopy was performed without difficulty. The                            patient tolerated the procedure well. The quality                            of the bowel preparation was excellent. The                            ileocecal valve, appendiceal orifice, and rectum                            were photographed. The bowel preparation used was                            SUPREP via split dose instruction. Scope In: 9:59:04 AM Scope Out: 10:16:33 AM Scope Withdrawal Time: 0 hours 11 minutes 17 seconds  Total Procedure Duration: 0 hours 17 minutes 29 seconds  Findings:                 The perianal and digital rectal examinations were                            normal. Pertinent negatives include normal prostate                            (size, shape, and consistency).                           Multiple diverticula were found in the sigmoid                            colon.  The exam was otherwise without abnormality on                            direct and retroflexion views. Complications:            No immediate complications. Estimated Blood Loss:     Estimated blood loss: none. Impression:               - Diverticulosis in the sigmoid colon.                           - The examination was otherwise normal on direct                            and retroflexion views.                           - No specimens collected. Recommendation:           - Patient has a contact number available for                            emergencies. The signs and symptoms of potential                            delayed complications were discussed with the                            patient. Return to normal activities tomorrow.                            Written discharge instructions were provided to the                            patient.                            - Resume previous diet.                           - Continue present medications.                           - Repeat colonoscopy in 10 years for screening                            purposes. Lupita FORBES Commander, MD 06/26/2024 10:27:07 AM This report has been signed electronically.

## 2024-06-26 NOTE — Patient Instructions (Addendum)
 No polyps or cancer were seen.  You do have diverticulosis - thickened muscle rings and pouches in the colon wall. Please read the handout about this condition.  Next routine colonoscopy or other screening test in 10 years - 2035.  I appreciate the opportunity to care for you. Leonard CHARLENA Commander, MD, [facility]  Resume previous diet Continue present medications Repeat colonoscopy in 10 years    YOU HAD AN ENDOSCOPIC PROCEDURE TODAY AT THE [facility]:   Refer to the procedure report that was given to you for any specific questions about what was found during the examination.  If the procedure report does not answer your questions, please call your gastroenterologist to clarify.  If you requested that your care partner not be given the details of your procedure findings, then the procedure report has been included in a sealed envelope for you to review at your convenience later.  YOU SHOULD EXPECT: Some feelings of bloating in the abdomen. Passage of more gas than usual.  Walking can help get rid of the air that was put into your GI tract during the procedure and reduce the bloating. If you had a lower endoscopy (such as a colonoscopy or flexible sigmoidoscopy) you may notice spotting of blood in your stool or on the toilet paper. If you underwent a bowel prep for your procedure, you may not have a normal bowel movement for a few days.  Please Note:  You might notice some irritation and congestion in your nose or some drainage.  This is from the oxygen used during your procedure.  There is no need for concern and it should clear up in a day or so.  SYMPTOMS TO REPORT IMMEDIATELY:  Following lower endoscopy (colonoscopy or flexible sigmoidoscopy):  Excessive amounts of blood in the stool  Significant tenderness or worsening of abdominal pains  Swelling of the abdomen that is new, acute  Fever of 100F or higher   For urgent or emergent issues, a gastroenterologist can be reached at  any hour by calling [phone]. Do not use MyChart messaging for urgent concerns.    DIET:  We do recommend a small meal at first, but then you may proceed to your regular diet.  Drink plenty of fluids but you should avoid alcoholic beverages for 24 hours.  ACTIVITY:  You should plan to take it easy for the rest of today and you should NOT DRIVE or use heavy machinery until tomorrow (because of the sedation medicines used during the test).    FOLLOW UP: Our staff will call the number listed on your records the next business day following your procedure.  We will call around 7:15- 8:00 am to check on you and address any questions or concerns that you may have regarding the information given to you following your procedure. If we do not reach you, we will leave a message.     If any biopsies were taken you will be contacted by phone or by letter within the next 1-3 weeks.  Please call us  at [phone] if you have not heard about the biopsies in 3 weeks.    SIGNATURES/CONFIDENTIALITY: You and/or your care partner have signed paperwork which will be entered into your electronic medical record.  These signatures attest to the fact that that the information above on your After Visit Summary has been reviewed and is understood.  Full responsibility of the confidentiality of this discharge information lies with you and/or your care-partner.

## 2024-06-27 ENCOUNTER — Telehealth: Payer: Self-pay

## 2024-06-27 NOTE — Telephone Encounter (Signed)
 Unable to reach pt.  Left HIPAA compliant voicemail.

## 2024-06-28 DIAGNOSIS — R0989 Other specified symptoms and signs involving the circulatory and respiratory systems: Secondary | ICD-10-CM | POA: Diagnosis not present

## 2024-06-28 DIAGNOSIS — R051 Acute cough: Secondary | ICD-10-CM | POA: Diagnosis not present

## 2024-07-19 ENCOUNTER — Ambulatory Visit: Admitting: Internal Medicine

## 2024-07-19 ENCOUNTER — Encounter: Payer: Self-pay | Admitting: Internal Medicine

## 2024-07-19 VITALS — BP 110/72 | HR 69 | Ht 67.0 in | Wt 185.0 lb

## 2024-07-19 DIAGNOSIS — J4540 Moderate persistent asthma, uncomplicated: Secondary | ICD-10-CM

## 2024-07-19 DIAGNOSIS — Z87891 Personal history of nicotine dependence: Secondary | ICD-10-CM

## 2024-07-19 DIAGNOSIS — J302 Other seasonal allergic rhinitis: Secondary | ICD-10-CM

## 2024-07-19 LAB — POCT EXHALED NITRIC OXIDE: FeNO level (ppb): 37

## 2024-07-19 MED ORDER — FLUTICASONE FUROATE-VILANTEROL 100-25 MCG/ACT IN AEPB: 1.0000 | INHALATION_SPRAY | Freq: Every day | RESPIRATORY_TRACT | 11 refills | Status: AC

## 2024-07-19 MED ORDER — ALBUTEROL SULFATE HFA 108 (90 BASE) MCG/ACT IN AERS: 2.0000 | INHALATION_SPRAY | Freq: Four times a day (QID) | RESPIRATORY_TRACT | 6 refills | Status: AC | PRN

## 2024-07-19 NOTE — Progress Notes (Signed)
 Leonard Cook    [id number]    03-27-1961  Primary Care Physician:Pharr, Ryan, MD  Referring Physician: Clarice Ryan, MD [address] [place],  [place] [address] Reason for Consultation: shortness of breath after covid Date of Consultation: 07/19/2024  Chief complaint:   Chief Complaint  Patient presents with   Consult    SOB Post COVID      HPI: Discussed the use of AI scribe software for clinical note transcription with the patient, who gave verbal consent to proceed.  History of Present Illness Leonard Cook is a 63 year old male who presents with recurrent episodes of bronchitis and shortness of breath. Referred by Consuelo for evaluation of respiratory issues.  Over the past year, he has experienced recurrent episodes of bronchitis and shortness of breath, characterized by a persistent, unproductive cough occurring in spasms and taking a long time to resolve. These episodes have increased in frequency, now occurring two to three times a year.  A significant decline in his respiratory health followed a severe COVID-19 infection in June 2024, which required emergency room care and treatment with Paxlovid  and IV steroids. The symptoms onset was rapid, with severe shortness of breath developing over a day.  During episodes, he experiences a productive cough with dark and colored phlegm, which responds to a Z-Pak. Albuterol  inhalers provide relief, calming coughing spasms and easing breathing. Between episodes, he generally feels normal but occasionally experiences a 'gurgle' in his chest.  He has no history of childhood respiratory issues and denies any history of smoking or significant exposure to secondhand smoke. Previously active in sports like cycling and tennis, he now feels unable to fully expand his lungs during exertion, limiting physical activities.  His past medical history includes reflux and seasonal allergies, which have become more  noticeable recently. He has a history of laryngeal spasms in his early 22s, which resolved over time with inhalers.  Family history is notable for his father, who lived to 72 and died of congestive heart failure, but there is no family history of chronic lung diseases or asthma.  He works in [employer], primarily in an office setting, and lives with his wife and 55 year old son. He has a Bengal cat at home but reports no significant allergies to it.    Social history:  Occupation: Ecologist Exposures: lives at home with wife and son, cat Smoking history: never  Social History   Occupational History   Occupation: Engineer, agricultural  Tobacco Use   Smoking status: Never    Passive exposure: Never   Smokeless tobacco: Never  Vaping Use   Vaping status: Never Used  Substance and Sexual Activity   Alcohol use: Yes    Comment: 2 beers a wk   Drug use: No   Sexual activity: Not on file    Relevant family history:  Family History  Problem Relation Age of Onset   Colon polyps Mother    Liver cancer Mother    Gallbladder disease Mother    Colon polyps Father    Prostate cancer Father    Hyperlipidemia Father    Heart disease Father 50       PCI x2   Heart failure Father    Hyperlipidemia Brother    Hypertension Brother    Heart attack Brother    Hyperlipidemia Brother    Esophageal cancer Maternal Grandfather    Heart attack Paternal Grandmother    Stroke Paternal  Grandfather    Colon cancer Neg Hx    Rectal cancer Neg Hx    Stomach cancer Neg Hx    Lung disease Neg Hx     Past Medical History:  Diagnosis Date   Allergic rhinitis due to pollen    Depressive disorder    GERD (gastroesophageal reflux disease)    Hyperlipidemia    IBS (irritable bowel syndrome)    Insomnia    Migraine    Obesity    Onychomycosis    Reflux    Thyroiditis    Trochanteric bursitis of right hip     Past Surgical History:  Procedure Laterality Date    CHOLECYSTECTOMY     age 69   ESOPHAGOGASTRODUODENOSCOPY  36   KNEE ARTHROSCOPY WITH ANTERIOR CRUCIATE LIGAMENT (ACL) REPAIR  01/2016   SHOULDER SURGERY Right      Physical Exam: Blood pressure 110/72, pulse 69, height 5' 7 (1.702 m), weight 185 lb (83.9 kg), SpO2 98%. Gen:      No acute distress ENT:  no nasal polyps, mucus membranes moist Lungs:    No increased respiratory effort, symmetric chest wall excursion, clear to auscultation bilaterally, no wheezes or crackles CV:         Regular rate and rhythm; no murmurs, rubs, or gallops.  No pedal edema Abd:      + bowel sounds; soft, non-tender; no distension MSK: no acute synovitis of DIP or PIP joints, no mechanics hands.  Skin:      Warm and dry; no rashes Neuro: normal speech, no focal facial asymmetry Psych: alert and oriented x3, normal mood and affect   Data Reviewed/Medical Decision Making:  Independent interpretation of tests: Imaging:  Review of patient's ct coronary calcium  images June 2025 revealed clear lung windows. The patient's images have been independently reviewed by me.    PFTs: I have personally reviewed the patient's PFTs and  Labs:  Lab Results  Component Value Date   NA 137 05/09/2024   K 5.0 05/09/2024   CO2 23 05/09/2024   GLUCOSE 90 05/09/2024   BUN 13 05/09/2024   CREATININE 1.29 (H) 05/09/2024   CALCIUM  9.9 05/09/2024   EGFR 62 05/09/2024   GFRNONAA >60 06/02/2023   Lab Results  Component Value Date   WBC 16.1 (H) 06/02/2023   HGB 14.1 06/02/2023   HCT 42.0 06/02/2023   MCV 92.9 06/02/2023   PLT 351 06/02/2023     Immunization status:  Immunization History  Administered Date(s) Administered   Tdap 01/15/2014     I reviewed prior external note(s) from ED visit  I reviewed the result(s) of the labs and imaging as noted above.   I have ordered feno   Assessment and Plan Assessment & Plan Moderate Persistent Asthma, new diagnosis Recurrent bronchitis episodes with steroid  and albuterol  responsiveness suggest asthma, likely triggered by infections, exertion, or allergens. Possible post-COVID onset. No childhood asthma or smoking history. Clinical presentation supports asthma diagnosis. - Start Breo 100 inhaler, one puff daily in the morning. - Educated on inhaler technique and gargling post-use to prevent thrush. - Prescribe albuterol  inhaler for rescue and pre-exertion use. - Instructed to use albuterol  before exercise and monitor response. - Order exhaled nitric oxide  test for allergic inflammation - 37 ppb is elevated consistent with allergic inflammation from asthma - Schedule follow-up in three months to assess treatment response.  Seasonal allergic rhinitis Increased symptoms of seasonal allergies with itchy, watery eyes and rhinorrhea.    Return to  Care: Return in about 3 months (around 10/19/2024).  Verdon Gore, MD Pulmonary and Critical Care Medicine [facility] Office:[phone]  CC: Clarice Nottingham, MD

## 2024-07-19 NOTE — Patient Instructions (Addendum)
 It was a pleasure to see you today!  Please schedule follow up with myself in 3 months.  If my schedule is not open yet, we will contact you with a reminder closer to that time. Please call [phone] if you haven't heard from us  a month before, and always call us  sooner if issues or concerns arise. You can also send us  a message through MyChart, but but aware that this is not to be used for urgent issues and it may take up to 5-7 days to receive a reply. Please be aware that you will likely be able to view your results before I have a chance to respond to them. Please give us  5 business days to respond to any non-urgent results.    YOUR PLAN:  -ASTHMA: Asthma is a condition where your airways narrow and swell, producing extra mucus, which can make breathing difficult. It seems to be triggered by infections, exertion, or allergens in your case. We are starting you on a Breo inhaler, which you should use one puff daily in the morning. You were shown how to use the inhaler and advised to gargle after use to prevent thrush. You will also have an albuterol  inhaler for immediate relief and to use before exercise. We will do an exhaled nitric oxide  test to check for allergic inflammation and follow up in three months to see how you are responding to the treatment.  Take the albuterol  rescue inhaler every 4 to 6 hours as needed for wheezing or shortness of breath. You can also take it 15 minutes before exercise or exertional activity. Side effects include heart racing or pounding, jitters or anxiety. If you have a history of an irregular heart rhythm, it can make this worse. Can also give some patients a hard time sleeping.   -SEASONAL ALLERGIC RHINITIS: Seasonal allergic rhinitis is an allergic reaction to pollen that causes sneezing, itchy and watery eyes, and a runny nose. Your symptoms have increased recently, and we will continue to monitor and manage them as needed. You can use over the counter  anti-histamine such as claritin or zyrtec.   By learning about asthma and how it can be controlled, you take an important step toward managing this disease. Work closely with your asthma care team to learn all you can about your asthma, how to avoid triggers, what your medications do, and how to take them correctly. With proper care, you can live free of asthma symptoms and maintain a normal, healthy lifestyle.   What is asthma? Asthma is a chronic disease that affects the airways of the lungs. During normal breathing, the bands of muscle that surround the airways are relaxed and air moves freely. During an asthma episode or attack, there are three main changes that stop air from moving easily through the airways: The bands of muscle that surround the airways tighten and make the airways narrow. This tightening is called bronchospasm.  The lining of the airways becomes swollen or inflamed.  The cells that line the airways produce more mucus, which is thicker than normal and clogs the airways.  These three factors - bronchospasm, inflammation, and mucus production - cause symptoms such as difficulty breathing, wheezing, and coughing.  What are the most common symptoms of asthma? Asthma symptoms are not the same for everyone. They can even change from episode to episode in the same person. Also, you may have only one symptom of asthma, such as cough, but another person may have all the symptoms  of asthma. It is important to know all the symptoms of asthma and to be aware that your asthma can present in any of these ways at any time. The most common symptoms include: Coughing, especially at night  Shortness of breath  Wheezing  Chest tightness, pain, or pressure   Who is affected by asthma? Asthma affects 22 million Americans; about 6 million of these are children under age 20. People who have a family history of asthma have an increased risk of developing the disease. Asthma is also more common  in people who have allergies or who are exposed to tobacco smoke. However, anyone can develop asthma at any time. Some people may have asthma all of their lives, while others may develop it as adults.  What causes asthma? The airways in a person with asthma are very sensitive and react to many things, or triggers. Contact with these triggers causes asthma symptoms. One of the most important parts of asthma control is to identify your triggers and then avoid them when possible. The only trigger you do not want to avoid is exercise. Pre-treatment with medicines before exercise can allow you to stay active yet avoid asthma symptoms. Common asthma triggers include: Infections (colds, viruses, flu, sinus infections)  Exercise  Weather (changes in temperature and/or humidity, cold air)  Tobacco smoke  Allergens (dust mites, pollens, pets, mold spores, cockroaches, and sometimes foods)  Irritants (strong odors from cleaning products, perfume, wood smoke, air pollution)  Strong emotions such as crying or laughing hard  Some medications   How is asthma diagnosed? To diagnose asthma, your doctor will first review your medical history, family history, and symptoms. Your doctor will want to know any past history of breathing problems you may have had, as well as a family history of asthma, allergies, eczema (a bumpy, itchy skin rash caused by allergies), or other lung disease. It is important that you describe your symptoms in detail (cough, wheeze, shortness of breath, chest tightness), including when and how often they occur. The doctor will perform a physical examination and listen to your heart and lungs. He or she may also order breathing tests, allergy tests, blood tests, and chest and sinus X-rays. The tests will find out if you do have asthma and if there are any other conditions that are contributing factors.  How is asthma treated? Asthma can be controlled, but not cured. It is not normal to have  frequent symptoms, trouble sleeping, or trouble completing tasks. Appropriate asthma care will prevent symptoms and visits to the emergency room and hospital. Asthma medicines are one of the mainstays of asthma treatment. The drugs used to treat asthma are explained below.  Anti-inflammatories: These are the most important drugs for most people with asthma. Anti-inflammatory drugs reduce swelling and mucus production in the airways. As a result, airways are less sensitive and less likely to react to triggers. These medications need to be taken daily and may need to be taken for several weeks before they begin to control asthma. Anti-inflammatory medicines lead to fewer symptoms, better airflow, less sensitive airways, less airway damage, and fewer asthma attacks. If taken every day, they CONTROL or prevent asthma symptoms.   Bronchodilators: These drugs relax the muscle bands that tighten around the airways. This action opens the airways, letting more air in and out of the lungs and improving breathing. Bronchodilators also help clear mucus from the lungs. As the airways open, the mucus moves more freely and can be coughed out more  easily. In short-acting forms, bronchodilators RELIEVE or stop asthma symptoms by quickly opening the airways and are very helpful during an asthma episode. In long-acting forms, bronchodilators provide CONTROL of asthma symptoms and prevent asthma episodes.  Asthma drugs can be taken in a variety of ways. Inhaling the medications by using a metered dose inhaler, dry powder inhaler, or nebulizer is one way of taking asthma medicines. Oral medicines (pills or liquids you swallow) may also be prescribed.  Asthma severity Asthma is classified as either intermittent (comes and goes) or persistent (lasting). Persistent asthma is further described as being mild, moderate, or severe. The severity of asthma is based on how often you have symptoms both during the day and night, as well  as by the results of lung function tests and by how well you can perform activities. The severity of asthma refers to how intense or strong your asthma is.  Asthma control Asthma control is the goal of asthma treatment. Regardless of your asthma severity, it may or may not be controlled. Asthma control means: You are able to do everything you want to do at work and home  You have no (or minimal) asthma symptoms  You do not wake up from your sleep or earlier than usual in the morning due to asthma  You rarely need to use your reliever medicine (inhaler)  Another major part of your treatment is that you are happy with your asthma care and believe your asthma is controlled.  Monitoring symptoms A key part of treatment is keeping track of how well your lungs are working. Monitoring your symptoms, what they are, how and when they happen, and how severe they are, is an important part of being able to control your asthma.  Sometimes asthma is monitored using a peak flow meter. A peak flow (PF) meter measures how fast the air comes out of your lungs. It can help you know when your asthma is getting worse, sometimes even before you have symptoms. By taking daily peak flow readings, you can learn when to adjust medications to keep asthma under good control. It is also used to create your asthma action plan (see below). Your doctor can use your peak flow readings to adjust your treatment plan in some cases.  Asthma Action Plan Based on your history and asthma severity, you and your doctor will develop a care plan called an "asthma action plan." The asthma action plan describes when and how to use your medicines, actions to take when asthma worsens, and when to seek emergency care. Make sure you understand this plan. If you do not, ask your asthma care provider any questions you may have. Your asthma action plan is one of the keys to controlling asthma. Keep it readily available to remind you of what you  need to do every day to control asthma and what you need to do when symptoms occur.  Goals of asthma therapy These are the goals of asthma treatment: Live an active, normal life  Prevent chronic and troublesome symptoms  Attend work or school every day  Perform daily activities without difficulty  Stop urgent visits to the doctor, emergency department, or hospital  Use and adjust medications to control asthma with few or no side effects

## 2024-07-19 NOTE — Telephone Encounter (Signed)
 Can you advise on cost and alternative options

## 2024-07-19 NOTE — Progress Notes (Signed)
The patient has been prescribed the inhaler breo. Inhaler technique was demonstrated to patient. The patient subsequently demonstrated correct technique.  

## 2024-07-20 ENCOUNTER — Telehealth: Payer: Self-pay

## 2024-07-20 ENCOUNTER — Other Ambulatory Visit (HOSPITAL_COMMUNITY): Payer: Self-pay

## 2024-07-20 NOTE — Telephone Encounter (Signed)
 Patient has a deductible to meet before prices go down.  Covered options are as follows (per test claims through [facility])  Brand Breo: $399.75 Brand Symbicort: $230.41 Dulera: $336.48 Brand Advair HFA: $313.28 Generic Advair Diskus: $211.28

## 2024-07-26 NOTE — Telephone Encounter (Signed)
**Note De-identified  [place] Obfuscation** Please advise 

## 2024-08-01 ENCOUNTER — Other Ambulatory Visit (HOSPITAL_COMMUNITY): Payer: Self-pay

## 2024-08-31 DIAGNOSIS — Z0000 Encounter for general adult medical examination without abnormal findings: Secondary | ICD-10-CM | POA: Diagnosis not present

## 2024-08-31 DIAGNOSIS — Z125 Encounter for screening for malignant neoplasm of prostate: Secondary | ICD-10-CM | POA: Diagnosis not present

## 2024-09-07 DIAGNOSIS — Z0000 Encounter for general adult medical examination without abnormal findings: Secondary | ICD-10-CM | POA: Diagnosis not present

## 2024-09-07 DIAGNOSIS — F339 Major depressive disorder, recurrent, unspecified: Secondary | ICD-10-CM | POA: Diagnosis not present

## 2024-09-07 DIAGNOSIS — Z23 Encounter for immunization: Secondary | ICD-10-CM | POA: Diagnosis not present

## 2024-09-07 DIAGNOSIS — I2510 Atherosclerotic heart disease of native coronary artery without angina pectoris: Secondary | ICD-10-CM | POA: Diagnosis not present

## 2024-09-07 DIAGNOSIS — K589 Irritable bowel syndrome without diarrhea: Secondary | ICD-10-CM | POA: Diagnosis not present

## 2024-09-07 DIAGNOSIS — J309 Allergic rhinitis, unspecified: Secondary | ICD-10-CM | POA: Diagnosis not present

## 2024-09-11 DIAGNOSIS — D485 Neoplasm of uncertain behavior of skin: Secondary | ICD-10-CM | POA: Diagnosis not present

## 2024-09-11 DIAGNOSIS — B078 Other viral warts: Secondary | ICD-10-CM | POA: Diagnosis not present

## 2024-09-11 DIAGNOSIS — L723 Sebaceous cyst: Secondary | ICD-10-CM | POA: Diagnosis not present

## 2024-09-11 DIAGNOSIS — L821 Other seborrheic keratosis: Secondary | ICD-10-CM | POA: Diagnosis not present

## 2024-09-11 DIAGNOSIS — D2262 Melanocytic nevi of left upper limb, including shoulder: Secondary | ICD-10-CM | POA: Diagnosis not present

## 2024-09-11 DIAGNOSIS — L905 Scar conditions and fibrosis of skin: Secondary | ICD-10-CM | POA: Diagnosis not present

## 2024-09-11 DIAGNOSIS — D225 Melanocytic nevi of trunk: Secondary | ICD-10-CM | POA: Diagnosis not present

## 2024-10-01 DIAGNOSIS — M65332 Trigger finger, left middle finger: Secondary | ICD-10-CM | POA: Diagnosis not present
